# Patient Record
Sex: Male | Born: 1955 | Race: White | Hispanic: No | Marital: Married | Smoking: Former smoker
Health system: Southern US, Community
[De-identification: ages and names within clinical notes are randomized; demographics above are authoritative.]

## PROBLEM LIST (undated history)

## (undated) DIAGNOSIS — N200 Calculus of kidney: Secondary | ICD-10-CM

## (undated) DIAGNOSIS — G4730 Sleep apnea, unspecified: Secondary | ICD-10-CM

## (undated) DIAGNOSIS — K449 Diaphragmatic hernia without obstruction or gangrene: Secondary | ICD-10-CM

## (undated) DIAGNOSIS — E785 Hyperlipidemia, unspecified: Secondary | ICD-10-CM

## (undated) DIAGNOSIS — G43909 Migraine, unspecified, not intractable, without status migrainosus: Secondary | ICD-10-CM

## (undated) DIAGNOSIS — R7309 Other abnormal glucose: Secondary | ICD-10-CM

## (undated) DIAGNOSIS — J8409 Other alveolar and parieto-alveolar conditions: Secondary | ICD-10-CM

## (undated) DIAGNOSIS — M1990 Unspecified osteoarthritis, unspecified site: Secondary | ICD-10-CM

## (undated) DIAGNOSIS — E039 Hypothyroidism, unspecified: Secondary | ICD-10-CM

## (undated) DIAGNOSIS — Z8042 Family history of malignant neoplasm of prostate: Secondary | ICD-10-CM

## (undated) DIAGNOSIS — Z8781 Personal history of (healed) traumatic fracture: Secondary | ICD-10-CM

## (undated) DIAGNOSIS — H3530 Unspecified macular degeneration: Secondary | ICD-10-CM

## (undated) DIAGNOSIS — I10 Essential (primary) hypertension: Secondary | ICD-10-CM

## (undated) DIAGNOSIS — K219 Gastro-esophageal reflux disease without esophagitis: Secondary | ICD-10-CM

## (undated) DIAGNOSIS — E7800 Pure hypercholesterolemia, unspecified: Secondary | ICD-10-CM

## (undated) DIAGNOSIS — M722 Plantar fascial fibromatosis: Secondary | ICD-10-CM

## (undated) DIAGNOSIS — L240 Irritant contact dermatitis due to detergents: Secondary | ICD-10-CM

## (undated) DIAGNOSIS — C801 Malignant (primary) neoplasm, unspecified: Secondary | ICD-10-CM

## (undated) DIAGNOSIS — T7840XA Allergy, unspecified, initial encounter: Secondary | ICD-10-CM

## (undated) HISTORY — DX: Allergy, unspecified, initial encounter: T78.40XA

## (undated) HISTORY — DX: Hypothyroidism, unspecified: E03.9

## (undated) HISTORY — DX: Unspecified macular degeneration: H35.30

## (undated) HISTORY — DX: Other abnormal glucose: R73.09

## (undated) HISTORY — PX: COLONOSCOPY: SHX174

## (undated) HISTORY — DX: Calculus of kidney: N20.0

## (undated) HISTORY — DX: Pure hypercholesterolemia, unspecified: E78.00

## (undated) HISTORY — DX: Personal history of (healed) traumatic fracture: Z87.81

## (undated) HISTORY — DX: Plantar fascial fibromatosis: M72.2

## (undated) HISTORY — DX: Migraine, unspecified, not intractable, without status migrainosus: G43.909

## (undated) HISTORY — DX: Other alveolar and parieto-alveolar conditions: J84.09

## (undated) HISTORY — DX: Family history of malignant neoplasm of prostate: Z80.42

## (undated) HISTORY — PX: OTHER SURGICAL HISTORY: SHX169

## (undated) HISTORY — DX: Sleep apnea, unspecified: G47.30

## (undated) HISTORY — DX: Hyperlipidemia, unspecified: E78.5

## (undated) HISTORY — DX: Malignant (primary) neoplasm, unspecified: C80.1

## (undated) HISTORY — PX: FOOT SURGERY: SHX648

## (undated) HISTORY — DX: Gastro-esophageal reflux disease without esophagitis: K21.9

## (undated) HISTORY — DX: Irritant contact dermatitis due to detergents: L24.0

## (undated) HISTORY — DX: Unspecified osteoarthritis, unspecified site: M19.90

## (undated) HISTORY — DX: Essential (primary) hypertension: I10

## (undated) HISTORY — DX: Diaphragmatic hernia without obstruction or gangrene: K44.9

---

## 1998-10-15 ENCOUNTER — Encounter: Payer: Self-pay | Admitting: Family Medicine

## 1999-04-20 DIAGNOSIS — K219 Gastro-esophageal reflux disease without esophagitis: Secondary | ICD-10-CM

## 1999-04-20 HISTORY — DX: Gastro-esophageal reflux disease without esophagitis: K21.9

## 1999-04-21 ENCOUNTER — Other Ambulatory Visit: Admission: RE | Admit: 1999-04-21 | Discharge: 1999-04-21 | Payer: Self-pay | Admitting: Gastroenterology

## 1999-04-21 ENCOUNTER — Encounter (INDEPENDENT_AMBULATORY_CARE_PROVIDER_SITE_OTHER): Payer: Self-pay | Admitting: Specialist

## 2000-07-14 ENCOUNTER — Encounter: Payer: Self-pay | Admitting: Family Medicine

## 2001-07-14 ENCOUNTER — Encounter: Payer: Self-pay | Admitting: Family Medicine

## 2003-07-27 ENCOUNTER — Emergency Department (HOSPITAL_COMMUNITY): Admission: EM | Admit: 2003-07-27 | Discharge: 2003-07-27 | Payer: Self-pay | Admitting: Emergency Medicine

## 2003-08-15 ENCOUNTER — Encounter: Payer: Self-pay | Admitting: Family Medicine

## 2004-05-04 ENCOUNTER — Ambulatory Visit: Payer: Self-pay | Admitting: Family Medicine

## 2004-08-05 ENCOUNTER — Ambulatory Visit: Payer: Self-pay | Admitting: Family Medicine

## 2004-08-14 ENCOUNTER — Encounter: Payer: Self-pay | Admitting: Family Medicine

## 2004-08-14 LAB — CONVERTED CEMR LAB: PSA: 0.63 ng/mL

## 2004-08-18 ENCOUNTER — Ambulatory Visit: Payer: Self-pay | Admitting: Family Medicine

## 2004-08-20 ENCOUNTER — Ambulatory Visit: Payer: Self-pay | Admitting: Family Medicine

## 2004-09-23 ENCOUNTER — Ambulatory Visit: Payer: Self-pay | Admitting: Pulmonary Disease

## 2004-10-21 ENCOUNTER — Ambulatory Visit (HOSPITAL_BASED_OUTPATIENT_CLINIC_OR_DEPARTMENT_OTHER): Admission: RE | Admit: 2004-10-21 | Discharge: 2004-10-21 | Payer: Self-pay | Admitting: Pulmonary Disease

## 2004-10-27 ENCOUNTER — Ambulatory Visit: Payer: Self-pay | Admitting: Family Medicine

## 2004-10-28 ENCOUNTER — Ambulatory Visit: Payer: Self-pay | Admitting: Pulmonary Disease

## 2004-10-29 ENCOUNTER — Ambulatory Visit: Payer: Self-pay | Admitting: Pulmonary Disease

## 2004-11-24 ENCOUNTER — Ambulatory Visit: Payer: Self-pay | Admitting: Family Medicine

## 2004-12-28 ENCOUNTER — Ambulatory Visit: Payer: Self-pay | Admitting: Pulmonary Disease

## 2005-02-24 ENCOUNTER — Ambulatory Visit: Payer: Self-pay | Admitting: Family Medicine

## 2005-05-31 ENCOUNTER — Ambulatory Visit: Payer: Self-pay | Admitting: Family Medicine

## 2005-08-31 ENCOUNTER — Ambulatory Visit: Payer: Self-pay | Admitting: Family Medicine

## 2005-10-12 ENCOUNTER — Ambulatory Visit: Payer: Self-pay | Admitting: Family Medicine

## 2005-12-16 ENCOUNTER — Ambulatory Visit: Payer: Self-pay | Admitting: Family Medicine

## 2006-01-03 ENCOUNTER — Ambulatory Visit: Payer: Self-pay | Admitting: Family Medicine

## 2006-01-03 LAB — CONVERTED CEMR LAB: PSA: 0.88 ng/mL

## 2006-01-05 ENCOUNTER — Ambulatory Visit: Payer: Self-pay | Admitting: Family Medicine

## 2006-02-06 ENCOUNTER — Ambulatory Visit: Payer: Self-pay | Admitting: Internal Medicine

## 2006-02-16 ENCOUNTER — Ambulatory Visit: Payer: Self-pay | Admitting: Family Medicine

## 2006-03-06 ENCOUNTER — Ambulatory Visit: Payer: Self-pay | Admitting: Family Medicine

## 2006-04-05 ENCOUNTER — Ambulatory Visit: Payer: Self-pay | Admitting: Family Medicine

## 2006-08-02 ENCOUNTER — Ambulatory Visit: Payer: Self-pay | Admitting: Family Medicine

## 2006-10-13 ENCOUNTER — Emergency Department: Payer: Self-pay | Admitting: Emergency Medicine

## 2006-10-19 ENCOUNTER — Emergency Department: Payer: Self-pay | Admitting: Emergency Medicine

## 2007-01-02 ENCOUNTER — Encounter: Payer: Self-pay | Admitting: Family Medicine

## 2007-01-02 DIAGNOSIS — E039 Hypothyroidism, unspecified: Secondary | ICD-10-CM

## 2007-01-02 DIAGNOSIS — R7309 Other abnormal glucose: Secondary | ICD-10-CM

## 2007-01-02 DIAGNOSIS — G43909 Migraine, unspecified, not intractable, without status migrainosus: Secondary | ICD-10-CM

## 2007-01-02 DIAGNOSIS — I10 Essential (primary) hypertension: Secondary | ICD-10-CM

## 2007-01-08 ENCOUNTER — Ambulatory Visit: Payer: Self-pay | Admitting: Family Medicine

## 2007-01-09 LAB — CONVERTED CEMR LAB
Albumin: 3.6 g/dL (ref 3.5–5.2)
Creatinine,U: 49.5 mg/dL
Free T4: 1 ng/dL (ref 0.6–1.6)
GFR calc Af Amer: 83 mL/min
GFR calc non Af Amer: 68 mL/min
HDL: 30.4 mg/dL — ABNORMAL LOW (ref >39.0)
LDL Cholesterol: 131 mg/dL — ABNORMAL HIGH (ref 0–99)
Microalb Creat Ratio: 6.1 mg/g (ref 0.0–30.0)
Microalb, Ur: 0.3 mg/dL (ref 0.0–1.9)
PSA: 0.79 ng/mL (ref 0.10–4.00)
Potassium: 4.5 meq/L (ref 3.5–5.1)
Sodium: 143 meq/L (ref 135–145)
Total Bilirubin: 0.7 mg/dL (ref 0.3–1.2)
Total CHOL/HDL Ratio: 6.1
Triglycerides: 119 mg/dL (ref 0–149)
VLDL: 24 mg/dL (ref 0–40)

## 2007-01-10 ENCOUNTER — Ambulatory Visit: Payer: Self-pay | Admitting: Family Medicine

## 2007-03-05 ENCOUNTER — Encounter (INDEPENDENT_AMBULATORY_CARE_PROVIDER_SITE_OTHER): Payer: Self-pay | Admitting: Internal Medicine

## 2007-03-10 ENCOUNTER — Ambulatory Visit: Payer: Self-pay | Admitting: Internal Medicine

## 2007-03-13 ENCOUNTER — Ambulatory Visit: Payer: Self-pay | Admitting: Family Medicine

## 2007-03-16 ENCOUNTER — Ambulatory Visit: Payer: Self-pay | Admitting: Family Medicine

## 2007-03-22 ENCOUNTER — Telehealth (INDEPENDENT_AMBULATORY_CARE_PROVIDER_SITE_OTHER): Payer: Self-pay | Admitting: *Deleted

## 2007-04-06 ENCOUNTER — Ambulatory Visit: Payer: Self-pay | Admitting: Family Medicine

## 2007-04-07 LAB — CONVERTED CEMR LAB
Chloride: 103 meq/L (ref 96–112)
GFR calc non Af Amer: 68 mL/min
Glucose, Bld: 111 mg/dL — ABNORMAL HIGH (ref 70–99)
Sodium: 139 meq/L (ref 135–145)

## 2007-04-10 ENCOUNTER — Ambulatory Visit: Payer: Self-pay | Admitting: Family Medicine

## 2007-04-25 ENCOUNTER — Telehealth: Payer: Self-pay | Admitting: Family Medicine

## 2007-05-01 ENCOUNTER — Ambulatory Visit: Payer: Self-pay | Admitting: Family Medicine

## 2007-06-05 ENCOUNTER — Ambulatory Visit: Payer: Self-pay | Admitting: Family Medicine

## 2007-06-05 DIAGNOSIS — L240 Irritant contact dermatitis due to detergents: Secondary | ICD-10-CM | POA: Insufficient documentation

## 2007-07-09 ENCOUNTER — Ambulatory Visit: Payer: Self-pay | Admitting: Family Medicine

## 2007-07-10 ENCOUNTER — Encounter: Admission: RE | Admit: 2007-07-10 | Discharge: 2007-07-10 | Payer: Self-pay | Admitting: Family Medicine

## 2007-07-30 ENCOUNTER — Ambulatory Visit: Payer: Self-pay | Admitting: Family Medicine

## 2007-08-21 ENCOUNTER — Ambulatory Visit: Payer: Self-pay | Admitting: Family Medicine

## 2007-08-22 ENCOUNTER — Telehealth: Payer: Self-pay | Admitting: Family Medicine

## 2007-08-29 ENCOUNTER — Encounter: Payer: Self-pay | Admitting: Family Medicine

## 2007-09-04 ENCOUNTER — Ambulatory Visit: Payer: Self-pay | Admitting: Cardiology

## 2007-09-04 ENCOUNTER — Ambulatory Visit: Payer: Self-pay | Admitting: Internal Medicine

## 2007-09-04 DIAGNOSIS — J8409 Other alveolar and parieto-alveolar conditions: Secondary | ICD-10-CM | POA: Insufficient documentation

## 2007-09-04 DIAGNOSIS — Z87891 Personal history of nicotine dependence: Secondary | ICD-10-CM

## 2007-09-04 DIAGNOSIS — J984 Other disorders of lung: Secondary | ICD-10-CM

## 2007-09-04 DIAGNOSIS — Z77090 Contact with and (suspected) exposure to asbestos: Secondary | ICD-10-CM

## 2007-09-05 ENCOUNTER — Ambulatory Visit: Payer: Self-pay | Admitting: Internal Medicine

## 2007-09-19 ENCOUNTER — Ambulatory Visit: Payer: Self-pay | Admitting: Internal Medicine

## 2007-09-26 ENCOUNTER — Telehealth: Payer: Self-pay | Admitting: Internal Medicine

## 2007-12-03 ENCOUNTER — Ambulatory Visit: Payer: Self-pay | Admitting: Cardiovascular Disease

## 2007-12-26 ENCOUNTER — Ambulatory Visit: Payer: Self-pay | Admitting: Internal Medicine

## 2008-02-01 ENCOUNTER — Ambulatory Visit: Payer: Self-pay | Admitting: Internal Medicine

## 2008-02-05 ENCOUNTER — Ambulatory Visit: Payer: Self-pay | Admitting: Cardiology

## 2008-02-06 ENCOUNTER — Telehealth: Payer: Self-pay | Admitting: Internal Medicine

## 2008-02-12 ENCOUNTER — Encounter: Payer: Self-pay | Admitting: Internal Medicine

## 2008-05-30 ENCOUNTER — Encounter (INDEPENDENT_AMBULATORY_CARE_PROVIDER_SITE_OTHER): Payer: Self-pay | Admitting: *Deleted

## 2008-08-08 ENCOUNTER — Ambulatory Visit: Payer: Self-pay | Admitting: Family Medicine

## 2008-09-16 ENCOUNTER — Encounter: Payer: Self-pay | Admitting: Family Medicine

## 2008-09-16 ENCOUNTER — Telehealth: Payer: Self-pay | Admitting: Family Medicine

## 2008-10-06 ENCOUNTER — Ambulatory Visit: Payer: Self-pay | Admitting: Family Medicine

## 2008-10-06 LAB — CONVERTED CEMR LAB
ALT: 29 units/L (ref 0–53)
AST: 26 units/L (ref 0–37)
BUN: 23 mg/dL (ref 6–23)
Basophils Absolute: 0 10*3/uL (ref 0.0–0.1)
Basophils Relative: 0.1 % (ref 0.0–3.0)
Bilirubin, Direct: 0.1 mg/dL (ref 0.0–0.3)
Chloride: 103 meq/L (ref 96–112)
Creatinine,U: 131.3 mg/dL
Direct LDL: 119.2 mg/dL
Eosinophils Relative: 2.6 % (ref 0.0–5.0)
Lymphs Abs: 2.4 10*3/uL (ref 0.7–4.0)
MCHC: 34.4 g/dL (ref 30.0–36.0)
MCV: 91.7 fL (ref 78.0–100.0)
Microalb Creat Ratio: 0.8 mg/g (ref 0.0–30.0)
Microalb, Ur: 0.1 mg/dL (ref 0.0–1.9)
Neutro Abs: 4 10*3/uL (ref 1.4–7.7)
Neutrophils Relative %: 54 % (ref 43.0–77.0)
PSA: 1.54 ng/mL (ref 0.10–4.00)
Platelets: 223 10*3/uL (ref 150.0–400.0)
Potassium: 4.1 meq/L (ref 3.5–5.1)
RDW: 12.7 % (ref 11.5–14.6)
Total Bilirubin: 0.8 mg/dL (ref 0.3–1.2)
VLDL: 39.8 mg/dL (ref 0.0–40.0)

## 2008-10-14 ENCOUNTER — Ambulatory Visit: Payer: Self-pay | Admitting: Family Medicine

## 2008-10-14 DIAGNOSIS — G4730 Sleep apnea, unspecified: Secondary | ICD-10-CM

## 2009-03-30 ENCOUNTER — Telehealth: Payer: Self-pay | Admitting: Family Medicine

## 2009-03-31 ENCOUNTER — Encounter: Payer: Self-pay | Admitting: Family Medicine

## 2009-08-12 ENCOUNTER — Telehealth: Payer: Self-pay | Admitting: Family Medicine

## 2009-10-14 ENCOUNTER — Telehealth: Payer: Self-pay | Admitting: Family Medicine

## 2009-10-15 ENCOUNTER — Encounter: Payer: Self-pay | Admitting: Family Medicine

## 2009-11-11 ENCOUNTER — Ambulatory Visit: Payer: Self-pay | Admitting: Family Medicine

## 2009-11-13 LAB — CONVERTED CEMR LAB
AST: 27 units/L (ref 0–37)
Albumin: 3.9 g/dL (ref 3.5–5.2)
CO2: 31 meq/L (ref 19–32)
Cholesterol: 192 mg/dL (ref 0–200)
Free T4: 0.87 ng/dL (ref 0.60–1.60)
Glucose, Bld: 100 mg/dL — ABNORMAL HIGH (ref 70–99)
HDL: 33.9 mg/dL — ABNORMAL LOW (ref >39.00)
Microalb Creat Ratio: 0.3 mg/g (ref 0.0–30.0)
Phosphorus: 3.7 mg/dL (ref 2.3–4.6)
Potassium: 4.4 meq/L (ref 3.5–5.1)
Sodium: 144 meq/L (ref 135–145)
TSH: 2.26 microintl units/mL (ref 0.35–5.50)
Total CHOL/HDL Ratio: 6
Triglycerides: 254 mg/dL — ABNORMAL HIGH (ref 0.0–149.0)

## 2009-12-08 DIAGNOSIS — E785 Hyperlipidemia, unspecified: Secondary | ICD-10-CM | POA: Insufficient documentation

## 2009-12-08 DIAGNOSIS — Z87442 Personal history of urinary calculi: Secondary | ICD-10-CM

## 2009-12-10 ENCOUNTER — Ambulatory Visit: Payer: Self-pay | Admitting: Family Medicine

## 2009-12-29 ENCOUNTER — Ambulatory Visit: Payer: Self-pay | Admitting: Internal Medicine

## 2009-12-29 DIAGNOSIS — R05 Cough: Secondary | ICD-10-CM

## 2010-01-04 ENCOUNTER — Telehealth: Payer: Self-pay | Admitting: Family Medicine

## 2010-01-04 ENCOUNTER — Ambulatory Visit: Payer: Self-pay | Admitting: Family Medicine

## 2010-01-04 DIAGNOSIS — J019 Acute sinusitis, unspecified: Secondary | ICD-10-CM

## 2010-01-06 ENCOUNTER — Encounter (INDEPENDENT_AMBULATORY_CARE_PROVIDER_SITE_OTHER): Payer: Self-pay | Admitting: *Deleted

## 2010-01-11 ENCOUNTER — Ambulatory Visit: Payer: Self-pay | Admitting: Gastroenterology

## 2010-01-19 ENCOUNTER — Telehealth (INDEPENDENT_AMBULATORY_CARE_PROVIDER_SITE_OTHER): Payer: Self-pay | Admitting: *Deleted

## 2010-01-21 ENCOUNTER — Ambulatory Visit: Payer: Self-pay | Admitting: Gastroenterology

## 2010-01-21 LAB — HM COLONOSCOPY

## 2010-01-28 ENCOUNTER — Ambulatory Visit: Payer: Self-pay | Admitting: Family Medicine

## 2010-01-28 DIAGNOSIS — M545 Low back pain: Secondary | ICD-10-CM | POA: Insufficient documentation

## 2010-04-22 ENCOUNTER — Telehealth: Payer: Self-pay | Admitting: Family Medicine

## 2010-04-26 ENCOUNTER — Ambulatory Visit: Payer: Self-pay | Admitting: Family Medicine

## 2010-04-26 DIAGNOSIS — R9389 Abnormal findings on diagnostic imaging of other specified body structures: Secondary | ICD-10-CM

## 2010-04-27 ENCOUNTER — Encounter: Payer: Self-pay | Admitting: Family Medicine

## 2010-04-27 ENCOUNTER — Ambulatory Visit: Payer: Self-pay

## 2010-04-27 DIAGNOSIS — R0989 Other specified symptoms and signs involving the circulatory and respiratory systems: Secondary | ICD-10-CM

## 2010-05-27 ENCOUNTER — Other Ambulatory Visit: Payer: Self-pay | Admitting: Family Medicine

## 2010-05-27 ENCOUNTER — Ambulatory Visit
Admission: RE | Admit: 2010-05-27 | Discharge: 2010-05-27 | Payer: Self-pay | Source: Home / Self Care | Attending: Family Medicine | Admitting: Family Medicine

## 2010-05-27 LAB — LIPID PANEL
Cholesterol: 162 mg/dL (ref 0–200)
HDL: 31.2 mg/dL — ABNORMAL LOW (ref >39.00)
Total CHOL/HDL Ratio: 5
Triglycerides: 237 mg/dL — ABNORMAL HIGH (ref 0.0–149.0)
VLDL: 47.4 mg/dL — ABNORMAL HIGH (ref 0.0–40.0)

## 2010-05-27 LAB — HEPATIC FUNCTION PANEL
ALT: 39 U/L (ref 0–53)
AST: 29 U/L (ref 0–37)
Albumin: 4.3 g/dL (ref 3.5–5.2)
Alkaline Phosphatase: 37 U/L — ABNORMAL LOW (ref 39–117)
Bilirubin, Direct: 0.1 mg/dL (ref 0.0–0.3)
Total Bilirubin: 0.7 mg/dL (ref 0.3–1.2)
Total Protein: 6.8 g/dL (ref 6.0–8.3)

## 2010-05-27 LAB — LDL CHOLESTEROL, DIRECT: Direct LDL: 90.6 mg/dL

## 2010-06-17 NOTE — Medication Information (Signed)
Summary: Davene Costain [employer] Prior [employer] for Nexium Capsule thro  [employer] Prior [employer] for Nexium Capsule through 04/13/10   Imported By: Beau Fanny 10/16/2009 13:56:45  _____________________________________________________________________  External Attachment:    Type:   Image     Comment:   External Document

## 2010-06-17 NOTE — Progress Notes (Signed)
Summary: prior Berkley Harvey is needed for nexium  Phone Note From Pharmacy   [facility] Summary of Call: Prior auth is needed for nexium, forms are on your shelf. Initial call taken by: Lowella Petties CMA,  October 14, 2009 5:02 PM  Follow-up for Phone Call        Signed. Follow-up by: Shaune Leeks MD,  October 15, 2009 7:33 AM  Additional Follow-up for Phone Call Additional follow up Details #1::        Forms faxed.              Additional Follow-up by: Lowella Petties CMA,  October 15, 2009 8:46 AM

## 2010-06-17 NOTE — Progress Notes (Signed)
Summary: pt requests phone call  Phone Note Call from Patient Call back at Home Phone [phone]   Caller: Patient Call For: [name] MD Summary of Call: Pt had a wisdom tooth cut out last week and some x-rays that were done showed some white spots going down his neck.  He would like to discuss this with you.  He is asking that you call him today if you can. Initial call taken by: Lowella Petties CMA, AAMA,  April 22, 2010 4:21 PM  Follow-up for Phone Call        I advised patient to get a copy of the images and then come in for OV so I could check him and look at the images.  We can proceed with vasc eval from that point as needed.  He agreed and he'll work on getting the scans.  Follow-up by: [name] MD,  April 22, 2010 6:03 PM

## 2010-06-17 NOTE — Assessment & Plan Note (Signed)
Summary: uri/alc   Vital Signs:  Patient profile:   55 year old male Height:      67 inches Weight:      182.75 pounds BMI:     28.73 Temp:     98.4 degrees F oral Pulse rate:   88 / minute Pulse rhythm:   regular BP sitting:   132 / 80  (left arm) Cuff size:   large  Vitals Entered By: Delilah Shan CMA Duncan Dull) (January 04, 2010 2:23 PM) CC: URI   History of Present Illness: Prev note read.  Has profuse rhinorrhea, yellow/green.  Cough with sputum production.  No fevers.   Voice change noted.  Ears feel okay.  Frontal HA.  >1 week symptoms.  Not improving.  No help with SABA use.   Allergies: 1)  ! Codeine Sulfate (Codeine Sulfate)  Review of Systems       See HPI.  Otherwise negative.    Physical Exam  General:  GEN: nad, alert and oriented HEENT: mucous membranes moist, TM w/o erythema, nasal epithelium injected, OP with cobblestoning, frontal and max sinuses tender to palpation  NECK: supple w/o LA CV: rrr. PULM: ctab, no inc wob ABD: soft, +bs EXT: no edema    Impression & Recommendations:  Problem # 1:  ACUTE SINUSITIS, UNSPECIFIED (ICD-461.9) Use naxonex samples 1-2 sprays per nostirl per day.  Rest/fluids and follow up as needed.  Amoxil in meantime.  Routine precuations/instructions given.   His updated medication list for this problem includes:    Fenesin Ir 400 Mg Tabs (Guaifenesin) ..... One in am, one at noon as needed cough    Benzonatate 100 Mg Caps (Benzonatate) ..... One by mouth three times a day as needed cough    Amoxicillin 875 Mg Tabs (Amoxicillin) .Marland Kitchen... 1 by mouth two times a day x10d  Complete Medication List: 1)  Propranolol Hcl 80 Mg Tabs (Propranolol hcl) .... Take one by mouth two times a day 2)  Synthroid 50 Mcg Tabs (Levothyroxine sodium) .... Take one by mouth once a day 3)  Guaifenesin  .... As needed 4)  Maxzide-25 37.5-25 Mg Tabs (Triamterene-hctz) .... One tab by mouth qam 5)  Albuterol Inhalation Solution  .Marland Kitchen.. 1 hhneb tx with  albuterol q4h as needed cough 6)  Nexium 40 Mg Cpdr (Esomeprazole magnesium) .... Two times a day 7)  Omega Red With [place]  .Marland Kitchen.. 1 daily by mouth 8)  Fenesin Ir 400 Mg Tabs (Guaifenesin) .... One in am, one at noon as needed cough 9)  Benzonatate 100 Mg Caps (Benzonatate) .... One by mouth three times a day as needed cough 10)  Amoxicillin 875 Mg Tabs (Amoxicillin) .Marland Kitchen.. 1 by mouth two times a day x10d  Patient Instructions: 1)  Use nasonex 1-2 sprays per nostril per day.  Take the amoxil for 10 days and let me know if you aren't doing better at the end of the 10 days.   2)  Please schedule a follow-up appointment as needed .  Prescriptions: AMOXICILLIN 875 MG TABS (AMOXICILLIN) 1 by mouth two times a day x10d  #20 x 0   Entered and Authorized by:   [name] MD   Signed by:   [name] MD on 01/04/2010   Method used:   [id number]       [address] [facility]  [place], [place]  [address]       Ph: [phone] or [phone]       Fax: [phone]   RxID:   [id number]   Current Allergies (reviewed today): ! CODEINE SULFATE (CODEINE SULFATE)

## 2010-06-17 NOTE — Progress Notes (Signed)
Summary: prep ?'s   Phone Note Call from Patient Call back at Home Phone [phone]   Caller: Patient Call For: Dr. Russella Dar Reason for Call: Talk to Nurse Summary of Call: prep ?'s Initial call taken by: Vallarie Mare,  January 19, 2010 2:44 PM  Follow-up for Phone Call        Phone Call Completed Follow-up by: Wyona Almas RN,  January 19, 2010 3:24 PM

## 2010-06-17 NOTE — Progress Notes (Signed)
Summary: allergies are bothering him  Phone Note Call from Patient Call back at Home Phone [phone]   Caller: Patient Call For: Shaune Leeks MD Summary of Call: Pt states his seasonal allergies are giving him a problem and he is asking what otc he can take with his BP meds. Initial call taken by: Lowella Petties CMA,  August 12, 2009 9:30 AM  Follow-up for Phone Call        Claritn, Allegra or Zyrtec all ok. Any nasal inhaler except Afrin ok. Follow-up by: Shaune Leeks MD,  August 12, 2009 10:08 AM  Additional Follow-up for Phone Call Additional follow up Details #1::        [facility] advising pt. Additional Follow-up by: Lowella Petties CMA,  August 12, 2009 10:25 AM

## 2010-06-17 NOTE — Assessment & Plan Note (Signed)
Summary: Adrian Dean FROM [facility]- CPX  CYD   Vital Signs:  Patient profile:   55 year old male Height:      67 inches Weight:      180.75 pounds BMI:     28.41 Temp:     98.5 degrees F oral Pulse rate:   80 / minute Pulse rhythm:   regular BP sitting:   138 / 88  (left arm) Cuff size:   regular  Vitals Entered By: Delilah Shan CMA Reneta Niehaus Dull) (December 10, 2009 3:08 PM) CC: Transfer from [facility], Preventive Care   History of Present Illness: CPE: see plan  Hypertension:      Using medication without problems or lightheadedness: yes Chest pain with exertion:no Edema:no Short of breath:no Average home BPs: 125/90 Other issues:   Hypothyroid- No overt symptoms of over/underreplacement.  No mass in neck or tenderness.  Recent labs reviewed with patient.  See recent chol/TG/glucose report.   GERD well controlled with current PPI.  No current symptoms.   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 Take One By Mouth Two Times A Day 2)  Synthroid 50 Mcg Tabs (Levothyroxine Sodium) .... Take One By Mouth  Once A Day 3)  Guaifenesin .... As Needed 4)  Maxzide-25 37.5-25 Mg  Tabs (Triamterene-Hctz) .... One Tab By Mouth Qam 5)  Albuterol Inhalation Solution .Marland Kitchen.. 1 Hhneb Tx With Albuterol Q4h As Needed Cough 6)  Nexium 40 Mg  Cpdr (Esomeprazole Magnesium) .... Two Times A Day 7)  Omega Red With [place] .Marland Kitchen.. 1 Daily By Mouth  Allergies: 1)  ! Codeine Sulfate (Codeine Sulfate)  Past History:  Past Surgical History: Last updated: 12/08/2009 [facility]  FRACTURED PELVIS W/ HIP PROB EGD, H. [facility]., GERD 04/20/99 2010, Plantar fasciitis on left foot  Family History: Last updated: 10/14/2008 Father: dec 78  Lung Ca with met dz, caught pneumonia,  prostate cancer at 54 years of age and a heart attack in 2001 Mother: dec  64 HTN, cirrhosis of the liver, and obesity from nonalcoholic problems Brother A 55  Bladder tumor, malignant CV + Father MI, angiopl  (stents) HBP + Self, mother DM - none Prostate cancer + Father Depression - none ETOH/Drug abuse - none Stroke - none cancer-lung--father, grandfather  Social History: Last updated: 12/10/2009 Marital Status: Married, since 1982 Cubs fan Children: One child at home with muscular dystrophy 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 Father: dec 78  Lung Ca with met dz, caught pneumonia,  prostate cancer at 54 years of age and a heart attack in 2001 Mother: dec  70 HTN, cirrhosis of the liver, and obesity from nonalcoholic problems Brother A 55  Bladder tumor, malignant CV + Father MI, angiopl  (stents) HBP + Self, mother DM - none Prostate cancer + Father Depression - none ETOH/Drug abuse - none Stroke - none cancer-lung--father, grandfather  Social History: Reviewed history from 12/08/2009 and no changes required. 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  Otherwise negative.   "I feel good."  Physical Exam  General:  GEN: nad, alert and oriented HEENT: mucous membranes moist NECK: supple w/o LA CV: rrr.  no murmur PULM: ctab, no inc wob ABD: soft, +bs EXT: no edema SKIN: no acute rash  Rectal:  No external abnormalities noted. Normal sphincter tone. No rectal masses or tenderness. Prostate:  Prostate gland firm and smooth, no enlargement, nodularity, tenderness, mass, asymmetry or induration.   Impression & Recommendations:  Problem # 1:  Preventive Health Care (ICD-V70.0) Refer to GI.  Labs reviewed.  D/w patient [id number].  Flul shot encouraged.   Problem # 2:  HYPOTHYROIDISM (ICD-244.9) No change in meds.  His updated medication list for this problem includes:    Synthroid 50 Mcg Tabs (Levothyroxine sodium) .Marland Kitchen... Take one by  mouth once a day  Problem # 3:  HYPERTENSION (ICD-401.9) No change in meds.  D/w patient [id number] and exercise.  His updated medication list for this problem includes:    Propranolol Hcl 80 Mg Tabs (Propranolol hcl) .Marland Kitchen... Take one by mouth two times a day    Maxzide-25 37.5-25 Mg Tabs (Triamterene-hctz) ..... One tab by mouth qam  Complete Medication List: 1)  Propranolol Hcl 80 Mg Tabs (Propranolol hcl) .... Take one by mouth two times a day 2)  Synthroid 50 Mcg Tabs (Levothyroxine sodium) .... Take one by mouth once a day 3)  Guaifenesin  .... As needed 4)  Maxzide-25 37.5-25 Mg Tabs (Triamterene-hctz) .... One tab by mouth qam 5)  Albuterol Inhalation Solution  .Marland Kitchen.. 1 hhneb tx with albuterol q4h as needed cough 6)  Nexium 40 Mg Cpdr (Esomeprazole magnesium) .... Two times a day 7)  Omega Red With [place]  .Marland Kitchen.. 1 daily by mouth  Other Orders: Gastroenterology Referral (GI)  Colorectal Screening:  Current Recommendations:    Hemoccult: NEG X 1 today    Colonoscopy recommended: scheduled with G.I.  PSA Screening:    PSA: 1.10  (11/11/2009)  Immunization & Chemoprophylaxis:    Tetanus vaccine: Td  (01/05/2006)    Influenza vaccine: Fluvax 3+  (02/01/2008)  Patient Instructions: 1)  See Aram Beecham about your referral before your leave today.   Prescriptions: NEXIUM 40 MG  CPDR (ESOMEPRAZOLE MAGNESIUM) two times a  day  #60 x 11   Entered and Authorized by:   [name] MD   Signed by:   [name] MD on 12/10/2009   Method used:   [facility]        [id number]       [address]       [place], [place]  [address]       Ph: [phone] or [phone]       Fax: [phone]   RxID:   [id number] MAXZIDE-25 37.5-25 MG  TABS (TRIAMTERENE-HCTZ) one tab by mouth qam  #30 Tablet x 11   Entered and Authorized by:   [name] MD   Signed by:   [name] MD on 12/10/2009   Method used:   [facility]        [id number]       [address]       [place], [place]  [address]       Ph: [phone] or [phone]       Fax: [phone]   RxID:   [id number] SYNTHROID 50 MCG TABS (LEVOTHYROXINE SODIUM) Take one by mouth once a day  #30 x 11   Entered and Authorized by:   [name] MD   Signed by:   [name] MD on 12/10/2009   Method used:   [facility]        [id number]       [address]       [place], [place]  [address]       Ph: [phone] or [phone]       Fax: [phone]   RxID:   [id number] PROPRANOLOL HCL 80 MG TABS (PROPRANOLOL HCL) Take one by mouth two times a day  #60 Tablet x 11   Entered and Authorized by:   [name] MD   Signed by:   [name] MD on 12/10/2009   Method used:   [facility]        [id number]       [address]       [place], [place]  [address]       Ph: [phone] or [phone]       Fax: [phone]   RxID:   [id number]   Current Allergies (reviewed today): ! CODEINE SULFATE (CODEINE SULFATE)      Prevention & Chronic Care Immunizations   Influenza vaccine: Fluvax 3+  (02/01/2008)    Tetanus booster: 01/05/2006: Td    Pneumococcal vaccine: Not documented  Colorectal Screening   Hemoccult: Not documented   Hemoccult action/deferral: NEG X 1 today  (12/10/2009)    Colonoscopy: Not documented   Colonoscopy action/deferral: scheduled with G.I.  (12/10/2009)  Other Screening   PSA: 1.10  (11/11/2009)   Smoking status: quit  (12/08/2009)  Lipids   Total Cholesterol: 192  (11/11/2009)   LDL: 131  (01/08/2007)   LDL Direct: 97.9  (11/11/2009)   HDL: 33.90  (11/11/2009)   Triglycerides: 254.0  (11/11/2009)    SGOT (AST): 27  (11/11/2009)   SGPT (ALT): 39  (11/11/2009)   Alkaline phosphatase: 33  (11/11/2009)  Total bilirubin: 0.4  (11/11/2009)    Lipid flowsheet  reviewed?: Yes  Hypertension   Last Blood Pressure: 138 / 88  (12/10/2009)   Serum creatinine: 1.1  (11/11/2009)   Serum potassium 4.4  (11/11/2009)    Hypertension flowsheet reviewed?: Yes  Self-Management Support :    Hypertension self-management support: Not documented    Lipid self-management support: Not documented

## 2010-06-17 NOTE — Miscellaneous (Signed)
Summary: Orders Update  Clinical Lists Changes  Problems: Added new problem of OTHER SYMPTOMS INVOLVING CARDIOVASCULAR SYSTEM (ICD-785.9) Orders: Added new Test order of Carotid Duplex (Carotid Duplex) - Signed 

## 2010-06-17 NOTE — Assessment & Plan Note (Signed)
Summary: OV per Dr. Para March   Vital Signs:  Patient profile:   55 year old male Height:      67 inches Weight:      178.25 pounds BMI:     28.02 Temp:     98.6 degrees F oral Pulse rate:   72 / minute Pulse rhythm:   regular BP sitting:   122 / 94  (left arm) Cuff size:   large  Vitals Entered By: Delilah Shan CMA Duncan Dull) (April 26, 2010 3:36 PM) CC: OV per Dr. Para March   History of Present Illness: Prev with tolerance to lipitor but had to come off due to finances.  Didn't tolerate crestor due to increase in LFTs.    Xrays done at dental office with opacity in the R side of neck, presumed to be near/in the carotid. No h/o CVA, TIA.  BP controlled.  No known bruit.  here for eval.   Allergies: 1)  ! Codeine Sulfate (Codeine Sulfate)  Review of Systems       See HPI.  Otherwise negative.    Physical Exam  General:  NCAT MMM neck supple, equal carotid upstroke w/o bruit RRR clear to auscultation bilaterally 2+ radial pulses, no bilateral lower extremity edema   Impression & Recommendations:  Problem # 1:  HYPERLIPIDEMIA (ICD-272.4) Start back on lipitor and return for fasting labs.  He agrees.  If myalgias, notify clinic.  Orders: Prescription Created Electronically [phone]) Radiology Referral (Radiology)  His updated medication list for this problem includes:    Lipitor 10 Mg Tabs (Atorvastatin calcium) .Marland Kitchen... 1 by mouth at bedtime  Problem # 2:  OTH NONSPC ABN FINDNG RAD&OTH EXM BODY STRUCTURE (ICD-793.99) Send for carotid dopplers with result to dictate tx from that point onward.  he agrees.  If any symptoms of CVA, the to ER emergently. he agrees.   Complete Medication List: 1)  Propranolol Hcl 80 Mg Tabs (Propranolol hcl) .... Take one by mouth two times a day 2)  Synthroid 50 Mcg Tabs (Levothyroxine sodium) .... Take one by mouth once a day 3)  Guaifenesin  .... As needed 4)  Maxzide-25 37.5-25 Mg Tabs (Triamterene-hctz) .... Take 1 tablet by mouth every  morning 5)  Albuterol Inhalation Solution  .Marland Kitchen.. 1 hhneb tx with albuterol q4h as needed cough 6)  Nexium 40 Mg Cpdr (Esomeprazole magnesium) .... Two times a day 7)  Omega Red With [place]  .Marland Kitchen.. 1 daily by mouth 8)  Flexeril 10 Mg Tabs (Cyclobenzaprine hcl) .... 0.5-1 tab by mouth three times a day as needed for pain with sedation caution. 9)  Vicodin 5-500 Mg Tabs (Hydrocodone-acetaminophen) .Marland Kitchen.. 1-2 tabs by mouth three times a day as needed for pain with sedation caution 10)  Lipitor 10 Mg Tabs (Atorvastatin calcium) .Marland Kitchen.. 1 by mouth at bedtime  Patient Instructions: 1)  See Shirlee Limerick about your referral before your leave today.  2)  Come back for fasting labs in 1 months.  Lipid and hepatic panel, dx 272.0. 3)  Start the lipitor today and take 1 at night.  Let me know if you have muscle aches on it.  Take care.  Prescriptions: LIPITOR 10 MG TABS (ATORVASTATIN CALCIUM) 1 by mouth at bedtime  #90 x 3   Entered and Authorized by:   [name] MD   Signed by:   [name] MD on 04/26/2010   Method used:   [facility]        [phone] [facility]  retail)       [address]       [place], [place]  [address]       Ph: [phone] or [phone]       Fax: [phone]   RxID:   [id number]    Orders Added: 1)  Est. Patient Level III [27253] 2)  Prescription Created Electronically [phone] 3)  Radiology Referral [Radiology]    Current Allergies (reviewed today): ! CODEINE SULFATE (CODEINE SULFATE)

## 2010-06-17 NOTE — Letter (Signed)
Summary: Moviprep Instructions  [facility]  [address]   [place], [place] [address]   Phone: [phone]  Fax: [phone]       Adrian Dean    11-10-1955    MRN: [id number]        Procedure Day Dorna Bloom: Thursday, 01-21-10     Arrival Time: 10:00 a.m.     Procedure Time: 11:00 a.m.     Location of Procedure:                    x  [facility] (4th Floor)   PREPARATION FOR COLONOSCOPY WITH MOVIPREP   Starting 5 days prior to your procedure 01-16-10  do not eat nuts, seeds, popcorn, corn, beans, peas,  salads, or any raw vegetables.  Do not take any fiber supplements (e.g. Metamucil, Citrucel, and Benefiber).  THE DAY BEFORE YOUR PROCEDURE         DATE:  01-20-10   DAY: Wednesday  1.  Drink clear liquids the entire day-NO SOLID FOOD  2.  Do not drink anything colored red or purple.  Avoid juices with pulp.  No orange juice.  3.  Drink at least 64 oz. (8 glasses) of fluid/clear liquids during the day to prevent dehydration and help the prep work efficiently.  CLEAR LIQUIDS INCLUDE: Water Jello Ice Popsicles Tea (sugar ok, no milk/cream) Powdered fruit flavored drinks Coffee (sugar ok, no milk/cream) Gatorade Juice: apple, white grape, white cranberry  Lemonade Clear bullion, consomm, broth Carbonated beverages (any kind) Strained chicken noodle soup Hard Candy                             4.  In the morning, mix first dose of MoviPrep solution:    Empty 1 Pouch A and 1 Pouch B into the disposable container    Add lukewarm drinking water to the top line of the container. Mix to dissolve    Refrigerate (mixed solution should be used within 24 hrs)  5.  Begin drinking the prep at 5:00 p.m. The MoviPrep container is divided by 4 marks.   Every 15 minutes drink the solution down to the next mark (approximately 8 oz) until the full liter is complete.   6.  Follow completed prep with 16 oz of clear liquid of your choice (Nothing red or  purple).  Continue to drink clear liquids until bedtime.  7.  Before going to bed, mix second dose of MoviPrep solution:    Empty 1 Pouch A and 1 Pouch B into the disposable container    Add lukewarm drinking water to the top line of the container. Mix to dissolve    Refrigerate  THE DAY OF YOUR PROCEDURE      DATE: 01-21-10  DAY: Thursday  Beginning at 6:00 a.m. (5 hours before procedure):         1. Every 15 minutes, drink the solution down to the next mark (approx 8 oz) until the full liter is complete.  2. Follow completed prep with 16 oz. of clear liquid of your choice.    3. You may drink clear liquids until  9:00 a.m. (2 HOURS BEFORE PROCEDURE).   MEDICATION INSTRUCTIONS  Unless otherwise instructed, you should take regular prescription medications with a small sip of water   as early as possible the morning of your procedure.   Additional medication instructions: Do NOT take Maxzide on day of procedure.  OTHER INSTRUCTIONS  You will need a responsible adult at least 54 years of age to accompany you and drive you home.   This person must remain in the waiting room during your procedure.  Wear loose fitting clothing that is easily removed.  Leave jewelry and other valuables at home.  However, you may wish to bring a book to read or  an iPod/MP3 player to listen to music as you wait for your procedure to start.  Remove all body piercing jewelry and leave at home.  Total time from sign-in until discharge is approximately 2-3 hours.  You should go home directly after your procedure and rest.  You can resume normal activities the  day after your procedure.  The day of your procedure you should not:   Drive   Make legal decisions   Operate machinery   Drink alcohol   Return to work  You will receive specific instructions about eating, activities and medications before you leave.    The above instructions have been reviewed and explained to me by    Durwin Glaze RN  January 11, 2010 4:03 PM     I fully understand and can verbalize these instructions _____________________________ Date _________

## 2010-06-17 NOTE — Procedures (Signed)
Summary: Colonoscopy  Patient: [name] Note: All result statuses are Final unless otherwise noted.  Tests: (1) Colonoscopy (COL)   COL Colonoscopy           DONE     [facility]     [address]     [place], [place]  [address]           COLONOSCOPY PROCEDURE REPORT           PATIENT:  Adrian, Dean  MR#:  [id number]     BIRTHDATE:  1956-04-14, 53 yrs. old  GENDER:  male     ENDOSCOPIST:  Judie Petit T. Russella Dar, MD, [facility]     Referred by:  [name], M.D.     PROCEDURE DATE:  01/21/2010     PROCEDURE:  Colonoscopy [id number]     ASA CLASS:  Class II     INDICATIONS:  1) Routine Risk Screening     MEDICATIONS:   Fentanyl 75 mcg IV, Versed 8 mg IV     DESCRIPTION OF PROCEDURE:   After the risks benefits and     alternatives of the procedure were thoroughly explained, informed     consent was obtained.  Digital rectal exam was performed and     revealed no abnormalities.   The LB [id number] endoscope     was introduced through the anus and advanced to the cecum, which     was identified by both the appendix and ileocecal valve, without     limitations.  The quality of the prep was excellent, using     MoviPrep.  The instrument was then slowly withdrawn as the colon     was fully examined.     <<PROCEDUREIMAGES>>     FINDINGS:  A normal appearing cecum, ileocecal valve, and     appendiceal orifice were identified. The ascending, hepatic     flexure, transverse, splenic flexure, descending, sigmoid colon,     and rectum appeared unremarkable. Retroflexed views in the rectum     revealed no abnormalities. The time to cecum =  1.75  minutes. The     scope was then withdrawn (time =  9.5  min) from the patient and     the procedure completed.           COMPLICATIONS:  None           ENDOSCOPIC IMPRESSION:     1) Normal colon           RECOMMENDATIONS:     1) Continue colorectal screening for "routine risk" patients     with a repeat colonoscopy in 10 years.          Venita Lick. Russella Dar, MD, [facility]           n.     eSIGNED:   Venita Lick. Stark at 01/21/2010 12:05 PM           [name], [name], [id number]  Note: An exclamation mark (!) indicates a result that was not dispersed into the flowsheet. Document Creation Date: 01/21/2010 12:06 PM _______________________________________________________________________  (1) Order result status: Final Collection or observation date-time: 01/21/2010 12:03 Requested date-time:  Receipt date-time:  Reported date-time:  Referring Physician:   Ordering Physician: Claudette Head [phone]) Specimen Source:  Source: Launa Grill Order Number: [id number] Lab site:   Appended Document: Colonoscopy    Clinical Lists Changes  Observations: Added new observation of COLONNXTDUE: 01/2020 (01/21/2010 13:07)

## 2010-06-17 NOTE — Miscellaneous (Signed)
Summary: [facility]  Clinical Lists Changes  Medications: Added new medication of MOVIPREP 100 GM  SOLR (PEG-KCL-NACL-NASULF-NA ASC-C) As per prep instructions. - Signed Rx of MOVIPREP 100 GM  SOLR (PEG-KCL-NACL-NASULF-NA ASC-C) As per prep instructions.;  #1 x 0;  Signed;  Entered by: Durwin Glaze RN;  Authorized by: Meryl Dare MD [facility];  Method [facility]: [id number] [address], [place], [place]  [address], Ph: [phone] or [phone], Fax: [phone] Observations: Added new observation of ALLERGY REV: Done (01/11/2010 15:17)    Prescriptions: MOVIPREP 100 GM  SOLR (PEG-KCL-NACL-NASULF-NA ASC-C) As per prep instructions.  #1 x 0   Entered by:   Durwin Glaze RN   Authorized by:   Meryl Dare MD [facility]   Signed by:   Durwin Glaze RN on 01/11/2010   Method used:   [facility]        [id number]ail[facility]       [address]       [place], [place]  [address]       Ph: [phone] or [phone]       Fax: [phone]   RxID:   [id number]

## 2010-06-17 NOTE — Assessment & Plan Note (Signed)
Summary: BACK PAIN SINCE SATURDAY/CLE   Vital Signs:  Patient profile:   55 year old male Height:      67 inches Weight:      179.75 pounds BMI:     28.25 Temp:     98.4 degrees F oral Pulse rate:   80 / minute Pulse rhythm:   regular BP sitting:   134 / 90  (left arm) Cuff size:   large  Vitals Entered By: Delilah Shan CMA Duncan Dull) (January 28, 2010 8:42 AM) CC: Back pain since Saturday   History of Present Illness: Back pain- Got up Saturday and was working on cars- washing them.  "Something caught when I was bending over."  Middle of lower back hurting since then.  Better standing.  Has to roll out of bed.  No radiation into legs but occ burning in thighs anteriorly (old sx).   No weakness.  No foot drop.  No audible pop heard  by patient.  No FCNAV.  No change in urine/bowels.   no relief with advil or heating pad.   Wants flu shot.   Allergies: 1)  ! Codeine Sulfate (Codeine Sulfate)  Review of Systems       See HPI.  Otherwise negative.    Physical Exam  General:  GEN: nad, alert and oriented, uncomfortable HEENT: mucous membranes moist NECK: supple CV: rrr.  no murmur PULM: ctab, no inc wob ABD: soft, +bs EXT: no edema Back: tender in a band across the lower back at waistline.  pain with back ext.  Less pain with flexion.  SLR neg bilaterally but pain with SI testing bilaterally.  distally NV intact.    Impression & Recommendations:  Problem # 1:  BACK PAIN (ICD-724.5) D/w patient UE:[id number] and med use.  Sedation caution.  follow up as needed.  this should get better gradually.  Likely combination of facet and SI irritation with subsequent muscle spasm.  No indication for imaging.  Relevant anatomy d/w patient.  His updated medication list for this problem includes:    Flexeril 10 Mg Tabs (Cyclobenzaprine hcl) .Marland Kitchen... 0.5-1 tab by mouth three times a day as needed for pain with sedation caution.    Vicodin 5-500 Mg Tabs (Hydrocodone-acetaminophen) .Marland Kitchen... 1-2  tabs by mouth three times a day as needed for pain with sedation caution  Complete Medication List: 1)  Propranolol Hcl 80 Mg Tabs (Propranolol hcl) .... Take one by mouth two times a day 2)  Synthroid 50 Mcg Tabs (Levothyroxine sodium) .... Take one by mouth once a day 3)  Guaifenesin  .... As needed 4)  Maxzide-25 37.5-25 Mg Tabs (Triamterene-hctz) .... Take 1 tablet by mouth every morning 5)  Albuterol Inhalation Solution  .Marland Kitchen.. 1 hhneb tx with albuterol q4h as needed cough 6)  Nexium 40 Mg Cpdr (Esomeprazole magnesium) .... Two times a day 7)  Omega Red With [place]  .Marland Kitchen.. 1 daily by mouth 8)  Flexeril 10 Mg Tabs (Cyclobenzaprine hcl) .... 0.5-1 tab by mouth three times a day as needed for pain with sedation caution. 9)  Vicodin 5-500 Mg Tabs (Hydrocodone-acetaminophen) .Marland Kitchen.. 1-2 tabs by mouth three times a day as needed for pain with sedation caution  Other Orders: Flu Vaccine 82yrs + MEDICARE PATIENTS (Y7829) Administration Flu vaccine - MCR (F6213)  Patient Instructions: 1)  Stretch your back, take ibuprofen as needed (with food), and use the flexeril and vicodin.  Both can make you drowsy.  If you aren't getting better, we can set  you up with physical therapy.  Take care.  Prescriptions: VICODIN 5-500 MG TABS (HYDROCODONE-ACETAMINOPHEN) 1-2 tabs by mouth three times a day as needed for pain with sedation caution  #30 x 1   Entered and Authorized by:   [name] MD   Signed by:   [name] MD on 01/28/2010   Method used:   Print then Give to Patient   RxID:   [id number] FLEXERIL 10 MG TABS (CYCLOBENZAPRINE HCL) 0.5-1 tab by mouth three times a day as needed for pain with sedation caution.  #30 x 1   Entered and Authorized by:   [name] MD   Signed by:   [name] MD on 01/28/2010   Method used:   [id number]       [address]       [place], [place]  [address]       Ph: [phone] or  [phone]       Fax: [phone]   RxID:   [id number]   Current Allergies (reviewed today): ! CODEINE SULFATE (CODEINE SULFATE)  Flu Vaccine Consent Questions     Do you have a history of severe allergic reactions to this vaccine? no    Any prior history of allergic reactions to egg and/or gelatin? no    Do you have a sensitivity to the preservative Thimersol? no    Do you have a past history of Guillan-Barre Syndrome? no    Do you currently have an acute febrile illness? no    Have you ever had a severe reaction to latex? no    Vaccine information given and explained to patient? yes    Are you currently pregnant? no    Lot Number:[id number]   Exp Date:11/13/2010   Site Given  Left Deltoid IMedflu Lugene Fuquay CMA (AAMA)  January 28, 2010 9:11 AM

## 2010-06-17 NOTE — Progress Notes (Signed)
Summary: not feeling any better  Phone Note Call from Patient Call back at Home Phone [phone]   Caller: Patient Call For: [name] MD Summary of Call: Patient was seen on 12-29-09. He was told to call back if not feeling any better. Patient says that he has runny nose and alot of congestion. Feels feverish, but doesn't know for sure if he has a fever or not. [facility] [place]  Initial call taken by: Melody Comas,  January 04, 2010 11:25 AM  Follow-up for Phone Call        can we have pt come in if not feeling better?  may need to be seen to ensure not becoming sinusitis. Follow-up by: Eustaquio Boyden  MD,  January 04, 2010 11:40 AM  Additional Follow-up for Phone Call Additional follow up Details #1::        Scheduled patient in available slot w/ Dr. Para March for today.  Additional Follow-up by: Melody Comas,  January 04, 2010 1:24 PM    Additional Follow-up for Phone Call Additional follow up Details #2::    thanks. Follow-up by: Eustaquio Boyden  MD,  January 04, 2010 1:38 PM  Additional Follow-up for Phone Call Additional follow up Details #3:: Additional Follow-up by: [name] MD,  January 04, 2010 1:48 PM

## 2010-06-17 NOTE — Assessment & Plan Note (Signed)
Summary: 11:30AM - CHEST COLD / LFW   Vital Signs:  Patient profile:   55 year old male Weight:      180.50 pounds Temp:     98.1 degrees F tympanic Pulse rate:   80 / minute Pulse rhythm:   regular BP sitting:   160 / 102  (left arm) Cuff size:   regular  Vitals Entered By: Selena Batten Dance CMA Duncan Dull) (December 29, 2009 11:23 AM) CC: Chest cold/congestion   History of Present Illness: CC: chest cold?  3d h/o RN, sinus drainage, eyes watering, ST, now chest congestion.  No fevers, chills, nausea,vomiting, diarrhea.  No abd pain, HA, sinus pain.  + tired.  + wife with sinus congestion at home.  + productive cough (yellow).  + postnasal drip.  + history of seasonal allergies.  Using ibuprofen, mucinex 12 hr, doesn't notice much difference.  [place].  Recently returned from [facility] vacation.  Son with muscular dystrophy, brother in hospital, recently had surgery (bladder CA).  Pt doesn't want anyone else getting sick.  No smokers at home.  bp typically runs normal range when not sick.  Preventive Screening-Counseling & Management  Alcohol-Tobacco     Smoking Status: quit  Current Medications (verified): 1)  Propranolol Hcl 80 Mg Tabs (Propranolol Hcl) .... Take One By Mouth Two Times A Day 2)  Synthroid 50 Mcg Tabs (Levothyroxine Sodium) .... Take One By Mouth Once A Day 3)  Guaifenesin .... As Needed 4)  Maxzide-25 37.5-25 Mg  Tabs (Triamterene-Hctz) .... One Tab By Mouth Qam 5)  Albuterol Inhalation Solution .Marland Kitchen.. 1 Hhneb Tx With Albuterol Q4h As Needed Cough 6)  Nexium 40 Mg  Cpdr (Esomeprazole Magnesium) .... Two Times A Day 7)  Omega Red With [place] .Marland Kitchen.. 1 Daily By Mouth  Allergies: 1)  ! 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) SLEEP APNEA (ICD-780.57) CONTACT DERMATITIS&OTHER ECZEMA DUE DETERGENTS (ICD-692.0) HEALTH MAINTENANCE EXAM (ICD-V70.0) SCREENING FOR MALIGNANT NEOPLASM, PROSTATE (ICD-V76.44) HYPERGLYCEMIA (ICD-790.29) RENAL CALCULUS (ICD-592.0) MIGRAINE HEADACHE (ICD-346.90) HYPERCHOLESTEROLEMIA/TRIG 226/267/131 (ICD-272.0) HX, FAMILY, MALIGNANCY, PROSTATE (YEARLY PSA'S) (ICD-V16.42) HYPOTHYROIDISM (ICD-244.9) HYPERTENSION (ICD-401.9) PMH-FH-SH reviewed for relevance  Review of Systems       per HPI  Physical Exam  General:  Well-developed,well-nourished,in no acute distress; alert,appropriate and cooperative throughout examination Head:  Normocephalic and atraumatic without obvious abnormalities. No apparent alopecia or balding.  no sinus pain Eyes:  No corneal or conjunctival inflammation noted. EOMI. Perrla. Ears:  canals bilaterally filled with cerumen. Nose:  pale nasal turbinates, not swollen Mouth:  Oral mucosa and oropharynx without lesions or exudates.  Teeth in good repair.  slight pharyngeal erythema Neck:  No deformities, masses, or tenderness noted.  + mild thyromegaly Lungs:  Normal respiratory effort, chest expands symmetrically. Lungs are clear to auscultation, no crackles or wheezes. Heart:  Normal rate and regular rhythm. S1 and S2 normal without gallop, murmur, click, rub or other extra sounds. Pulses:  2+ periph pulses Extremities:  no clubbing, cyanosis, edema, or deformity noted Skin:  intact without lesions or rashes   Impression & Recommendations:  Problem # 1:  COUGH (ICD-786.2) h/o allergies.  3 day hx illness, likely viral URTI.  Wife with similar sxs.  Treat supportively.  codeine allergy, prefers to stay away from narcotics as works during day.  trial of benzonatate perls and guaifenesin.  RTC for red flags or if not improving as expected.  Complete Medication List: 1)   Propranolol Hcl 80 Mg Tabs (Propranolol hcl) .... Take one by mouth two times a day 2)  Synthroid 50 Mcg Tabs (Levothyroxine sodium) .... Take one by mouth once a day 3)  Guaifenesin  .... As needed 4)  Maxzide-25 37.5-25 Mg Tabs (Triamterene-hctz) .... One tab by mouth qam 5)  Albuterol Inhalation Solution  .Marland Kitchen.. 1 hhneb tx with albuterol q4h as needed cough 6)  Nexium 40 Mg Cpdr (Esomeprazole magnesium) .... Two times a day 7)  Omega Red With [place]  .Marland Kitchen.. 1 daily by mouth 8)  Fenesin Ir 400 Mg Tabs (Guaifenesin) .... One in am, one at noon as needed cough 9)  Benzonatate 100 Mg Caps (Benzonatate) .... One by mouth three times a day as needed cough  Patient Instructions: 1)  Sounds like you have a viral upper respiratory infection. 2)  Antibiotics are not needed for this.  Viral infections usually take 7-10 days to resolve.  The cough can last up to 4-6 weeks to go away. 3)  Use medication as prescribed: tessalon perls for cough suppression (let us know if not working), guaifenesin 400mg  one in am and one at noon.  Drink plenty of fluid with guaifenesin. 4)  Please return if you are not improving as expected, or if you have high fevers (>101.5) or difficulty swallowing or worsening cough. 5)  Call clinic with questions.  Pleasure to see you today!  Prescriptions: BENZONATATE 100 MG CAPS (BENZONATATE) one by mouth three times a day as needed cough  #30 x 1   Entered and Authorized by:   Eustaquio Boyden  MD   Signed by:   Eustaquio Boyden  MD on 12/29/2009   Method used:   [facility] [facility]ail[facility]       [address]       [place], [place]  [address]       Ph: [phone] or [phone]       Fax: [phone]   RxID:   [id number] FENESIN IR 400 MG TABS (GUAIFENESIN) one in am, one at noon as needed cough  #30 x 0   Entered and Authorized by:   Eustaquio Boyden  MD   Signed by:   Eustaquio Boyden  MD on 12/29/2009   Method used:    [facility]        [facility]  [address] (retail)       [address]       [place], [place]  [address]       Ph: [phone] or [phone]       Fax: [phone]   RxID:   [id number] BENZONATATE 100 MG CAPS (BENZONATATE) one by mouth three times a day as needed cough  #30 x 1   Entered and Authorized by:   Eustaquio Boyden  MD   Signed by:   Eustaquio Boyden  MD on 12/29/2009   Method used:   [facility]        [id number]       [address]       [place], [place]  [address]       Ph: [phone] or [phone]       Fax: [phone]   RxID:  [id number] FENESIN IR 400 MG TABS (GUAIFENESIN) one in am, one at noon as needed cough  #30 x 0   Entered and Authorized by:   Eustaquio Boyden  MD   Signed by:   Eustaquio Boyden  MD on 12/29/2009   Method used:   [facility]        [id number]ail[facility]       [address]       [place], [place]  [address]       Ph: [phone] or [phone]       Fax: [phone]   RxID:   [id number]   Current Allergies (reviewed today): ! CODEINE SULFATE (CODEINE SULFATE)

## 2010-06-21 ENCOUNTER — Ambulatory Visit (INDEPENDENT_AMBULATORY_CARE_PROVIDER_SITE_OTHER): Payer: BC Managed Care – PPO | Admitting: Family Medicine

## 2010-06-21 ENCOUNTER — Encounter: Payer: Self-pay | Admitting: Family Medicine

## 2010-06-21 DIAGNOSIS — J069 Acute upper respiratory infection, unspecified: Secondary | ICD-10-CM

## 2010-06-23 ENCOUNTER — Telehealth: Payer: Self-pay | Admitting: Family Medicine

## 2010-07-01 NOTE — Progress Notes (Signed)
Summary: wants antibiotic   Phone Note Call from Patient Call back at Home Phone [phone] P PH     Caller: Patient Call For: [name] MD Summary of Call: Patient was seen on monday, he says that he is not feeling any better and he is asking for an antibiotic to be [facility]  Initial call taken by: Melody Comas,  June 23, 2010 11:00 AM  Follow-up for Phone Call        thought may be early sinusits 2 days ago, may call in amoxicillin.  update if not better.  plz call. Follow-up by: Eustaquio Boyden  MD,  June 23, 2010 1:44 PM  Additional Follow-up for Phone Call Additional follow up Details #1::        Patient notified of rx.  Additional Follow-up by: Melody Comas,  June 23, 2010 2:01 PM    New/Updated Medications: AMOXICILLIN 875 MG TABS (AMOXICILLIN) take one by mouth two times a day x 10 days Prescriptions: AMOXICILLIN 875 MG TABS (AMOXICILLIN) take one by mouth two times a day x 10 days  #20 x 0   Entered by:   Eustaquio Boyden  MD   Authorized by:   [name] MD   Signed by:   Eustaquio Boyden  MD on 06/23/2010   Method used:   [id number]       [address]       [place], [place]  [address]       Ph: [phone] or [phone]       Fax: [phone]   RxID:   [id number]

## 2010-07-01 NOTE — Assessment & Plan Note (Signed)
Summary: COLD/CLE  BCBS   Vital Signs:  Patient profile:   55 year old male Weight:      183.25 pounds Temp:     98.3 degrees F oral Pulse rate:   68 / minute Pulse rhythm:   regular BP sitting:   124 / 80  (left arm) Cuff size:   large  Vitals Entered By: Selena Batten Dance CMA Duncan Dull) (June 21, 2010 3:48 PM) CC: Cold   History of Present Illness: CC: cold  3d h/o chest congestion, laryngitis, hoarseness.  + coughing productive of yellowish green sputum.  + nasal congestion and rhinorrhea.  + HA.  Has tried mucinex, excedrin migraine.  tried son's albuterol treatment.    No abd pain, f/c, n/v/d, rashes, chest pain, SOB, myalgias, arthralgias  No smokers at home.  + wife sick as well with bronchitis.  remote smoking history.  No h/o asthma.  son with muscular dystrophy  Current Medications (verified): 1)  Propranolol Hcl 80 Mg Tabs (Propranolol Hcl) .... Take One By Mouth Two Times A Day 2)  Synthroid 50 Mcg Tabs (Levothyroxine Sodium) .... Take One By Mouth Once A Day 3)  Guaifenesin .... As Needed 4)  Maxzide-25 37.5-25 Mg  Tabs (Triamterene-Hctz) .... Take 1 Tablet By Mouth Every Morning 5)  Albuterol Inhalation Solution .Marland Kitchen.. 1 Hhneb Tx With Albuterol Q4h As Needed Cough 6)  Nexium 40 Mg  Cpdr (Esomeprazole Magnesium) .... Two Times A Day 7)  Omega Red With [place] .Marland Kitchen.. 1 Daily By Mouth 8)  Lipitor 10 Mg Tabs (Atorvastatin Calcium) .Marland Kitchen.. 1 By Mouth At Bedtime  Allergies: 1)  ! 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) SLEEP APNEA (ICD-780.57) CONTACT DERMATITIS&OTHER ECZEMA DUE DETERGENTS (ICD-692.0) HEALTH MAINTENANCE EXAM (ICD-V70.0) SCREENING FOR  MALIGNANT NEOPLASM, PROSTATE (ICD-V76.44) HYPERGLYCEMIA (ICD-790.29) RENAL CALCULUS (ICD-592.0) MIGRAINE HEADACHE (ICD-346.90) HYPERCHOLESTEROLEMIA/TRIG 226/267/131 (ICD-272.0) HX, FAMILY, MALIGNANCY, PROSTATE (YEARLY PSA'S) (ICD-V16.42) HYPOTHYROIDISM (ICD-244.9) HYPERTENSION (ICD-401.9)  Social History: Last updated: 12/10/2009 Marital Status: Married, since 1982 [employer] fan Children: One child at home with muscular dystrophy 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 No apparent alopecia or balding.  no sinus pain Eyes:  No corneal or conjunctival inflammation noted. EOMI. Perrla. Ears:  TMs clear bilaterally Nose:  pale nasal turbinates, not swollen Mouth:  Oral mucosa and oropharynx without lesions or exudates.  Teeth in good repair.  slight pharyngeal erythema Neck:  No deformities, masses, or tenderness noted.  no LAD Lungs:  Normal respiratory effort, chest expands symmetrically. Lungs are clear to auscultation, no crackles or wheezes. Heart:  Normal rate and regular rhythm. S1 and S2 normal without gallop, murmur, click, rub or other extra sounds. Pulses:  2+ rad pulses, brisk cap refill Extremities:  no pedal edema Skin:  intact without lesions or rashes   Impression & Recommendations:  Problem # 1:  VIRAL URI (ICD-465.9) only 3 days in, likely viral.  could be early sinusitis.  supportive care as per instructions.  refilled albuterol, tessalon and advised continued  mucinex.  sent in flonase as well as pt states this has helped in past..  His updated medication list for this problem includes:    Tessalon Perles  100 Mg Caps (Benzonatate) ..... One by mouth three times a day as needed cough  Complete Medication List: 1)  Propranolol Hcl 80 Mg Tabs (Propranolol hcl) .... Take one by mouth two times a day 2)  Synthroid 50 Mcg Tabs (Levothyroxine sodium) .... Take one by mouth once a day 3)  Guaifenesin  .... As needed 4)  Maxzide-25 37.5-25 Mg Tabs (Triamterene-hctz) .... Take 1 tablet by mouth every morning 5)  Nexium 40 Mg Cpdr (Esomeprazole magnesium) .... Two times a day 6)  Omega Red With [place]  .Marland Kitchen.. 1 daily by mouth 7)  Lipitor 10 Mg Tabs (Atorvastatin calcium) .Marland Kitchen.. 1 by mouth at bedtime 8)  Ventolin Hfa 108 (90 Base) Mcg/act Aers (Albuterol sulfate) .... 2 puffs q4-6 hours as needed cough/wheezing 9)  Tessalon Perles 100 Mg Caps (Benzonatate) .... One by mouth three times a day as needed cough 10)  Flonase 50 Mcg/act Susp (Fluticasone propionate) .... 2 squirts in each nostril in am  Patient Instructions: 1)  Sounds viral upper respiratory infection. 2)  Viral infections usually take 7-10 days to resolve.  The cough can last 4 weeks to go away. 3)  Use medication as prescribed: tessalon perls and albuterol, flonase as well. 4)  albuterol scheduled next 2-3 days. 5)  For laryngitis, have to wait it out.  Try herbal teas as well as sucking on hard candy. 6)  Please return if you are not improving as expected, or if you have high fevers (>101.5) or difficulty swallowing. 7)  Call clinic with questions.  Pleasure to see you today. Prescriptions: FLONASE 50 MCG/ACT SUSP (FLUTICASONE PROPIONATE) 2 squirts in each nostril in am  #1 x 1   Entered and Authorized by:   Eustaquio Boyden  MD   Signed by:   Eustaquio Boyden  MD on 06/21/2010   Method used:   [facility]        [id number]       [address]       [place], [place]  [address]       Ph: [phone] or [phone]       Fax: [phone]   RxID:   [id number] TESSALON PERLES 100 MG  CAPS (BENZONATATE) one by mouth three times a day as needed cough  #60 x 0   Entered and Authorized by:   Eustaquio Boyden  MD   Signed by:   Eustaquio Boyden  MD on 06/21/2010   Method used:   [facility]        [id number]       [address]       [place], [place]  [address]       Ph: [phone] or [phone]       Fax: [phone]   RxID:   [id number] VENTOLIN HFA 108 (90 BASE) MCG/ACT AERS (ALBUTEROL SULFATE) 2 puffs q4-6 hours as needed cough/wheezing  #1 x 3   Entered and Authorized by:   Eustaquio Boyden  MD   Signed by:   Eustaquio Boyden  MD on 06/21/2010   Method used:   [phone] [facility] (retail)       [address]       [place], [place]  [address]       Ph: [phone] or [phone]       Fax: [phone]   RxID:   [id number]    Orders Added: 1)  Est. Patient Level III [[address]]    Current Allergies (reviewed today): ! CODEINE SULFATE (CODEINE SULFATE)

## 2010-07-16 ENCOUNTER — Encounter: Payer: Self-pay | Admitting: Family Medicine

## 2010-07-16 ENCOUNTER — Ambulatory Visit (INDEPENDENT_AMBULATORY_CARE_PROVIDER_SITE_OTHER): Payer: BC Managed Care – PPO | Admitting: Family Medicine

## 2010-07-16 DIAGNOSIS — J019 Acute sinusitis, unspecified: Secondary | ICD-10-CM

## 2010-07-20 ENCOUNTER — Encounter: Payer: Self-pay | Admitting: Family Medicine

## 2010-07-20 ENCOUNTER — Ambulatory Visit (INDEPENDENT_AMBULATORY_CARE_PROVIDER_SITE_OTHER): Payer: BC Managed Care – PPO | Admitting: Family Medicine

## 2010-07-20 DIAGNOSIS — R05 Cough: Secondary | ICD-10-CM

## 2010-07-22 NOTE — Assessment & Plan Note (Signed)
Summary: ??sinus infection   Vital Signs:  Patient profile:   55 year old male Height:      67 inches Weight:      183.25 pounds BMI:     28.80 Temp:     98.4 degrees F oral Pulse rate:   72 / minute Pulse rhythm:   regular BP sitting:   120 / 80  (left arm) Cuff size:   large  Vitals Entered By: Delilah Shan CMA Duncan Dull) (July 16, 2010 9:36 AM) CC: ? sinus infection   History of Present Illness: He got better from illness in 1/12.  Sx restarted back up  ~1 week ago with sinus symptoms, cough, rhinorrhea.  No fevers.  Using tessalon, delsym w/o much relief.  Used the ventolin with help with the cough.  No sick contacts.  Flonase helps some with the nasal symptoms.  No NAV.    Allergies: 1)  ! Codeine Sulfate (Codeine Sulfate)  Social History: Reviewed history from 12/10/2009 and no changes required. 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  Otherwise negative.    Physical Exam  General:  GEN: nad, alert and oriented HEENT: mucous membranes moist, TM w/o erythema, nasal epithelium injected, OP with cobblestoning,frontal sinuses tender to palpation bilaterally NECK: supple w/o LA CV: rrr. PULM: ctab, no inc wob, dry cough noted EXT: no edema    Impression & Recommendations:  Problem # 1:  ACUTE SINUSITIS, UNSPECIFIED (ICD-461.9) Given duration and exam, I would start the zmax and use flonase.  He has the ventolin for as needed use.  His lungs are clear and he is nontoxic.  follow up as needed.  supportive tx o/w.  He agrees.  The following medications were removed from the medication list:    Amoxicillin 875 Mg Tabs (Amoxicillin) .Marland Kitchen... Take one by mouth two times a day x 10 days His updated medication list for  this problem includes:    Tessalon Perles 100 Mg Caps (Benzonatate) ..... One by mouth three times a day as needed cough    Flonase 50 Mcg/act Susp (Fluticasone propionate) .Marland Kitchen... 2 squirts in each nostril in am    Zithromax 250 Mg Tabs (Azithromycin) .Marland Kitchen... 2 by mouth today and then 1 by mouth once daily for 4 days  Orders: Prescription Created Electronically [phone])  Complete Medication List: 1)  Propranolol Hcl 80 Mg Tabs (Propranolol hcl) .... Take one by mouth two times a day 2)  Synthroid 50 Mcg Tabs (Levothyroxine sodium) .... Take one by mouth once a day 3)  Guaifenesin  .... As needed 4)  Maxzide-25 37.5-25 Mg Tabs (Triamterene-hctz) .... Take 1 tablet by mouth every morning 5)  Nexium 40 Mg Cpdr (Esomeprazole magnesium) .... Two times a day 6)  Omega Red With [place]  .Marland Kitchen.. 1 daily by mouth 7)  Lipitor 10 Mg Tabs (Atorvastatin calcium) .Marland Kitchen.. 1 by mouth at bedtime 8)  Ventolin Hfa 108 (90 Base) Mcg/act Aers (Albuterol sulfate) .... 2 puffs q4-6 hours as needed cough/wheezing 9)  Tessalon Perles 100 Mg Caps (Benzonatate) .... One by mouth three times a day as needed cough 10)  Flonase 50 Mcg/act Susp (Fluticasone propionate) .... 2 squirts in each nostril in am 11)  Zithromax 250 Mg  Tabs (Azithromycin) .... 2 by mouth today and then 1 by mouth once daily for 4 days  Patient Instructions: 1)  Start the antibiotics today.  Let me know if you aren't improving.  2)  Get plenty of rest, drink lots of clear liquids, and use Tylenol for fever and comfort. Keep using the albuterol and flonase as needed. 3)  Take care. Prescriptions: ZITHROMAX 250 MG TABS (AZITHROMYCIN) 2 by mouth today and then 1 by mouth once daily for 4 days  #6 x 0   Entered and Authorized by:   [name] MD   Signed by:   [name] MD on 07/16/2010   Method used:   [facility]        [facility] [address] [address]       [address]       [place], [place]  [address]       Ph: [phone] or  [phone]       Fax: [phone]   RxID:   [id number]    Orders Added: 1)  Prescription Created Electronically [G8553] 2)  Est. Patient Level III [[address]]    Current Allergies (reviewed today): ! CODEINE SULFATE (CODEINE SULFATE)

## 2010-07-27 NOTE — Assessment & Plan Note (Signed)
Summary: COUGH/RBH   Vital Signs:  Patient profile:   55 year old male Height:      67 inches Weight:      184 pounds BMI:     28.92 Temp:     98.6 degrees F oral Pulse rate:   84 / minute Pulse rhythm:   regular BP sitting:   126 / 80  (left arm) Cuff size:   large  Vitals Entered By: Delilah Shan CMA Yelena Metzer Dull) (July 20, 2010 10:45 AM) CC: Cough   History of Present Illness: Still with cough.  Prev note reviewed.  No fevers.  Coughing fits that come on.  No coughing to the point of vomiting.  Some sputum, yellow/green.  The cough isn't worse, but it isn't getting better.  No help with tessalon.  No facial pressure- better now.  Coughing until his sides hurt.  Son is sick at home.  Som wheeze, some relief with the SABA.  Minimal ST.  Voice change noted.  Took last dose of zmax today.   Allergies: 1)  ! Codeine Sulfate (Codeine Sulfate)  Review of Systems       See HPI.  Otherwise negative.    Physical Exam  General:  GEN: nad, alert and oriented HEENT: mucous membranes moist, TM w/o erythema, nasal epithelium less injected than prev, OP with mild cobblestoning,frontal sinuses not  tender to palpation bilaterally NECK: supple w/o LA CV: rrr. PULM: scattered ronchi, no inc wob, dry cough noted EXT: no edema    Impression & Recommendations:  Problem # 1:  COUGH (ICD-786.2) Will treat for bronchitis.  Will start augmentin.  He can't tolerate most cough meds.  With his wheezing, start the prednisone and use SABA as needed. Okay for outpatient follow up.  He agrees.  Orders: Prescription Created Electronically [phone])  Complete Medication List: 1)  Propranolol Hcl 80 Mg Tabs (Propranolol hcl) .... Take one by mouth two times a day 2)  Synthroid 50 Mcg Tabs (Levothyroxine sodium) .... Take one by mouth once a day 3)  Guaifenesin  .... As needed 4)  Maxzide-25 37.5-25 Mg Tabs (Triamterene-hctz) .... Take 1 tablet by mouth every morning 5)  Nexium 40 Mg Cpdr (Esomeprazole  magnesium) .... Two times a day 6)  Omega Red With [place]  .Marland Kitchen.. 1 daily by mouth 7)  Lipitor 10 Mg Tabs (Atorvastatin calcium) .Marland Kitchen.. 1 by mouth at bedtime 8)  Ventolin Hfa 108 (90 Base) Mcg/act Aers (Albuterol sulfate) .... 2 puffs q4-6 hours as needed cough/wheezing 9)  Tessalon Perles 100 Mg Caps (Benzonatate) .... One by mouth three times a day as needed cough 10)  Flonase 50 Mcg/act Susp (Fluticasone propionate) .... 2 squirts in each nostril in am 11)  Prednisone 20 Mg Tabs (Prednisone) .Marland Kitchen.. 1 by mouth once daily with food for 5 days. 12)  Augmentin 500-125 Mg Tabs (Amoxicillin-pot clavulanate) .Marland Kitchen.. 1 by mouth two times a day x10d  Patient Instructions: 1)  I would start the prednisone today.  Take it with food.  Start the augmentin and take it two times a day.  Use the inhaler.  This should gradually get better.  Let a doctor know if you get short of breath.  Take care.  Prescriptions: AUGMENTIN 500-125 MG TABS (AMOXICILLIN-POT CLAVULANATE) 1 by mouth two times a day x10d  #20 x 0   Entered and Authorized by:   [name] MD   Signed by:   [name] MD on 07/20/2010   Method used:  [facility]        [facility]       [address]       [place], [place]  [address]       Ph: [phone] or [phone]       Fax: [phone]   RxID:   [id number] PREDNISONE 20 MG TABS (PREDNISONE) 1 by mouth once daily with food for 5 days.  #5 x 0   Entered and Authorized by:   [name] MD   Signed by:   [name] MD on 07/20/2010   Method used:   [facility]        [facility] [address] [facility]ail)       [address]       [place], [place]  [address]       Ph: [phone] or [phone]       Fax: [phone]   RxID:   [id number]    Orders Added: 1)  Prescription Created Electronically [G8553] 2)  Est. Patient Level III [[address]]    Current Allergies (reviewed today): ! CODEINE SULFATE (CODEINE SULFATE)

## 2010-07-31 ENCOUNTER — Encounter: Payer: Self-pay | Admitting: Family Medicine

## 2010-10-01 NOTE — Procedures (Signed)
NAMEDAVISON, OHMS NO.:  [id number]   MEDICAL RECORD NO.:  [id number]          PATIENT TYPE:  OUT   LOCATION:  SLEEP CENTER                 FACILITY:  [facility]   PHYSICIAN:  Marcelyn Bruins, M.D. [facility] DATE OF BIRTH:  1956-03-20   DATE OF STUDY:  10/21/2004                              NOCTURNAL POLYSOMNOGRAM   REFERRING PHYSICIAN:  Marcelyn Bruins, MD   INDICATION FOR THE STUDY:  Hypersomnia with sleep apnea. Epworth score: 18.   SLEEP ARCHITECTURE:  The patient had a total sleep time of 362 minutes with  adequate REM, but decreased slow wave sleep. Sleep onset latency was normal  and REM onset was less than usual.   IMPRESSION:  1.  Mild obstructive sleep apnea/hypopnea syndrome with a respiratory      disturbance index of 13 events per hour and O2 desaturation as low as      87%. Events were more common in the supine position. Treatment for this      degree of some sleep apnea may include weight loss alone, upper airway      surgery, oral appliance and CPAP. Clinical correlation is suggested.  2.  Moderate to severe snoring noted throughout.  3.  Occasional premature ventricular complexes.     ______________________________  Suzzette Righter    KC/MEDQ  D:  10/25/2004 15:26:29  T:  10/25/2004 16:40:31  Job:  [id number]

## 2010-12-07 ENCOUNTER — Other Ambulatory Visit: Payer: Self-pay | Admitting: Family Medicine

## 2010-12-07 DIAGNOSIS — Z125 Encounter for screening for malignant neoplasm of prostate: Secondary | ICD-10-CM

## 2010-12-07 DIAGNOSIS — I10 Essential (primary) hypertension: Secondary | ICD-10-CM

## 2010-12-07 DIAGNOSIS — E039 Hypothyroidism, unspecified: Secondary | ICD-10-CM

## 2010-12-13 ENCOUNTER — Other Ambulatory Visit (INDEPENDENT_AMBULATORY_CARE_PROVIDER_SITE_OTHER): Payer: BC Managed Care – PPO | Admitting: Family Medicine

## 2010-12-13 DIAGNOSIS — E039 Hypothyroidism, unspecified: Secondary | ICD-10-CM

## 2010-12-13 DIAGNOSIS — I10 Essential (primary) hypertension: Secondary | ICD-10-CM

## 2010-12-13 DIAGNOSIS — Z125 Encounter for screening for malignant neoplasm of prostate: Secondary | ICD-10-CM

## 2010-12-13 LAB — COMPREHENSIVE METABOLIC PANEL
ALT: 40 U/L (ref 0–53)
AST: 27 U/L (ref 0–37)
Albumin: 4.4 g/dL (ref 3.5–5.2)
Alkaline Phosphatase: 32 U/L — ABNORMAL LOW (ref 39–117)
Calcium: 9.3 mg/dL (ref 8.4–10.5)
Chloride: 104 mEq/L (ref 96–112)
Potassium: 4.2 mEq/L (ref 3.5–5.1)
Sodium: 142 mEq/L (ref 135–145)
Total Protein: 6.7 g/dL (ref 6.0–8.3)

## 2010-12-13 LAB — LIPID PANEL
HDL: 35.4 mg/dL — ABNORMAL LOW (ref >39.00)
Total CHOL/HDL Ratio: 4
VLDL: 45.4 mg/dL — ABNORMAL HIGH (ref 0.0–40.0)

## 2010-12-16 ENCOUNTER — Ambulatory Visit (INDEPENDENT_AMBULATORY_CARE_PROVIDER_SITE_OTHER): Payer: BC Managed Care – PPO | Admitting: Family Medicine

## 2010-12-16 ENCOUNTER — Encounter: Payer: Self-pay | Admitting: Family Medicine

## 2010-12-16 DIAGNOSIS — E039 Hypothyroidism, unspecified: Secondary | ICD-10-CM

## 2010-12-16 DIAGNOSIS — R7309 Other abnormal glucose: Secondary | ICD-10-CM

## 2010-12-16 DIAGNOSIS — E785 Hyperlipidemia, unspecified: Secondary | ICD-10-CM

## 2010-12-16 DIAGNOSIS — Z8042 Family history of malignant neoplasm of prostate: Secondary | ICD-10-CM

## 2010-12-16 DIAGNOSIS — I10 Essential (primary) hypertension: Secondary | ICD-10-CM

## 2010-12-16 DIAGNOSIS — Z0000 Encounter for general adult medical examination without abnormal findings: Secondary | ICD-10-CM

## 2010-12-16 NOTE — Progress Notes (Signed)
CPE- See plan.  Routine anticipatory guidance given to patient.  See health maintenance.  Hypertension:    Using medication without problems or lightheadedness: yes Chest pain with exertion:no Edema:no Short of breath:no Average home BPs: usually 120s/80s.    Elevated Cholesterol: Using medications without problems:yes Muscle aches: no Other complaints:no  FH prostate CA.  PSA wnl.  Labs d/w pt.    PMH and SH reviewed  Meds, vitals, and allergies reviewed.   ROS: See HPI.  Otherwise negative.    GEN: nad, alert and oriented HEENT: mucous membranes moist NECK: supple w/o LA CV: rrr. PULM: ctab, no inc wob ABD: soft, +bs EXT: no edema SKIN: no acute rash Prostate gland firm and smooth, no enlargement, nodularity, tenderness, mass, asymmetry or induration.

## 2010-12-16 NOTE — Patient Instructions (Signed)
I would get a flu shot each fall.   Take care.  Let me know if you have concerns.  Glad to see you.  Try to use the treadmill and cut out the snack cakes.

## 2010-12-17 ENCOUNTER — Encounter: Payer: Self-pay | Admitting: Family Medicine

## 2010-12-17 DIAGNOSIS — Z0000 Encounter for general adult medical examination without abnormal findings: Secondary | ICD-10-CM | POA: Insufficient documentation

## 2010-12-17 DIAGNOSIS — Z8042 Family history of malignant neoplasm of prostate: Secondary | ICD-10-CM | POA: Insufficient documentation

## 2010-12-17 NOTE — Assessment & Plan Note (Signed)
BP controlled and labs d/w pt. D/w pt about diet and weight.

## 2010-12-17 NOTE — Assessment & Plan Note (Signed)
Healthy habits encouraged.  Up to date on psa/colon CA screening.   D/w pt about diet and weight.

## 2010-12-17 NOTE — Assessment & Plan Note (Signed)
Continue current meds.   D/w pt about diet and weight.

## 2010-12-17 NOTE — Assessment & Plan Note (Signed)
Normal exam and PSA wnl

## 2010-12-17 NOTE — Assessment & Plan Note (Signed)
Mild elevation.  D/w pt about diet and weight.

## 2010-12-17 NOTE — Assessment & Plan Note (Signed)
tsh wnl, no TMG on exam.  

## 2011-01-04 ENCOUNTER — Other Ambulatory Visit: Payer: Self-pay | Admitting: Family Medicine

## 2011-01-05 ENCOUNTER — Other Ambulatory Visit: Payer: Self-pay | Admitting: *Deleted

## 2011-01-05 MED ORDER — TRIAMTERENE-HCTZ 37.5-25 MG PO TABS: 1.0000 | ORAL_TABLET | Freq: Every day | ORAL | Status: DC

## 2011-01-18 ENCOUNTER — Encounter: Payer: Self-pay | Admitting: Pulmonary Disease

## 2011-02-08 ENCOUNTER — Other Ambulatory Visit: Payer: Self-pay | Admitting: Family Medicine

## 2011-03-23 ENCOUNTER — Ambulatory Visit (INDEPENDENT_AMBULATORY_CARE_PROVIDER_SITE_OTHER): Payer: BC Managed Care – PPO

## 2011-03-23 DIAGNOSIS — Z23 Encounter for immunization: Secondary | ICD-10-CM

## 2011-04-22 ENCOUNTER — Other Ambulatory Visit: Payer: Self-pay | Admitting: Family Medicine

## 2011-05-04 ENCOUNTER — Telehealth: Payer: Self-pay | Admitting: Internal Medicine

## 2011-05-04 NOTE — Telephone Encounter (Signed)
See if he can be added on tomorrow.  Thanks.

## 2011-05-04 NOTE — Telephone Encounter (Signed)
Patient advised.  Scheduled for 12 noon.

## 2011-05-04 NOTE — Telephone Encounter (Signed)
Patient called and stated he is having headaches, sinus draining, coughing up yellowish phelgm, and body aches.  The first available appointment isn't until Friday.  He wanted to know if you could call in an antibiotic.

## 2011-05-05 ENCOUNTER — Ambulatory Visit (INDEPENDENT_AMBULATORY_CARE_PROVIDER_SITE_OTHER): Payer: BC Managed Care – PPO | Admitting: Family Medicine

## 2011-05-05 ENCOUNTER — Encounter: Payer: Self-pay | Admitting: Family Medicine

## 2011-05-05 DIAGNOSIS — J329 Chronic sinusitis, unspecified: Secondary | ICD-10-CM

## 2011-05-05 MED ORDER — FLUTICASONE PROPIONATE 50 MCG/ACT NA SUSP: NASAL | Status: DC

## 2011-05-05 MED ORDER — ALBUTEROL SULFATE HFA 108 (90 BASE) MCG/ACT IN AERS: 1.0000 | INHALATION_SPRAY | Freq: Four times a day (QID) | RESPIRATORY_TRACT | Status: DC | PRN

## 2011-05-05 MED ORDER — AZITHROMYCIN 250 MG PO TABS: ORAL_TABLET | ORAL | Status: AC

## 2011-05-05 NOTE — Progress Notes (Signed)
duration of symptoms: 2-3 days ago rhinorrhea:yes congestion:yes ear pain:  no sore throat:yes Cough:yes, with postnasal gtt.  Myalgias: yes other concerns: headache- constant- facial pain, body aches, can't use cpap due to congestion No fevers  ROS: See HPI.  Otherwise negative.    Meds, vitals, and allergies reviewed.   GEN: nad, alert and oriented HEENT: mucous membranes moist, TM w/o erythema, nasal epithelium injected, OP with cobblestoning, Sinus ttp x4, purulent nasal discharge NECK: supple w/o LA CV: rrr. PULM: ctab, no inc wob ABD: soft, +bs EXT: no edema

## 2011-05-05 NOTE — Patient Instructions (Signed)
Use nasal saline and flonase.  Start the antibiotics today.  Drink plenty of fluids, take tylenol as needed, and gargle with warm salt water for your throat.  This should gradually improve.  Take care.  Let us know if you have other concerns.

## 2011-05-06 ENCOUNTER — Encounter: Payer: Self-pay | Admitting: Family Medicine

## 2011-05-06 DIAGNOSIS — J329 Chronic sinusitis, unspecified: Secondary | ICD-10-CM | POA: Insufficient documentation

## 2011-05-06 NOTE — Assessment & Plan Note (Signed)
Given the exam, start abx.  Use flonase, use SABA prn for cough.  Lungs ctab.  Okay for outpatient fu.

## 2011-07-05 ENCOUNTER — Encounter: Payer: Self-pay | Admitting: Family Medicine

## 2011-07-05 ENCOUNTER — Ambulatory Visit (INDEPENDENT_AMBULATORY_CARE_PROVIDER_SITE_OTHER): Payer: BC Managed Care – PPO | Admitting: Family Medicine

## 2011-07-05 VITALS — BP 116/84 | HR 84 | Temp 98.4°F | Wt 193.0 lb

## 2011-07-05 DIAGNOSIS — R05 Cough: Secondary | ICD-10-CM

## 2011-07-05 DIAGNOSIS — R059 Cough, unspecified: Secondary | ICD-10-CM

## 2011-07-05 MED ORDER — BENZONATATE 200 MG PO CAPS: 200.0000 mg | ORAL_CAPSULE | Freq: Three times a day (TID) | ORAL | Status: AC | PRN

## 2011-07-05 MED ORDER — AZITHROMYCIN 250 MG PO TABS: ORAL_TABLET | ORAL | Status: AC

## 2011-07-05 NOTE — Progress Notes (Signed)
Wife is sick and so is his son.  Stared about 1 week ago.  No fevers.  Cough, some sputum, green.  Some rhinorrhea.   No ear pain.  ST.  Chest hurst from coughing.  Unable to tolerate CPAP last night.  Usually uses CPAP.  Using SABA last few days with some relief.    Meds, vitals, and allergies reviewed.   ROS: See HPI.  Otherwise, noncontributory.  nad ncat Tm wnl  Nasal epithelium injected Op with cobblestoning Cough with deep breath, ronchi at [facility] cleared with cough.  No wheeze.  abd soft, not ttp Ext w/o edema

## 2011-07-05 NOTE — Patient Instructions (Signed)
Drink plenty of fluids, take tessalon for cough, start the antibiotics and gargle with warm salt water for your throat.  This should gradually improve.  Take care.  Let us know if you have other concerns.

## 2011-07-07 DIAGNOSIS — R05 Cough: Secondary | ICD-10-CM | POA: Insufficient documentation

## 2011-07-07 NOTE — Assessment & Plan Note (Signed)
Drink plenty of fluids, take tessalon for cough, start the zithromax and gargle with warm salt water for your throat. This should gradually improve.

## 2011-08-25 ENCOUNTER — Encounter: Payer: Self-pay | Admitting: Family Medicine

## 2011-08-25 DIAGNOSIS — M25519 Pain in unspecified shoulder: Secondary | ICD-10-CM | POA: Insufficient documentation

## 2011-11-12 ENCOUNTER — Other Ambulatory Visit: Payer: Self-pay | Admitting: Family Medicine

## 2011-11-14 NOTE — Telephone Encounter (Signed)
Clarify with patient and notify me.

## 2011-11-14 NOTE — Telephone Encounter (Signed)
?   If this patient is taking med as prescribed according to refills since 9/12.

## 2011-11-15 NOTE — Telephone Encounter (Signed)
Sent!

## 2011-11-15 NOTE — Telephone Encounter (Signed)
Patient says he has been taking it as prescribed.

## 2011-12-07 ENCOUNTER — Other Ambulatory Visit: Payer: Self-pay | Admitting: Family Medicine

## 2012-01-09 ENCOUNTER — Other Ambulatory Visit: Payer: Self-pay | Admitting: Family Medicine

## 2012-01-09 DIAGNOSIS — E039 Hypothyroidism, unspecified: Secondary | ICD-10-CM

## 2012-01-09 DIAGNOSIS — E7800 Pure hypercholesterolemia, unspecified: Secondary | ICD-10-CM

## 2012-01-09 DIAGNOSIS — Z8042 Family history of malignant neoplasm of prostate: Secondary | ICD-10-CM

## 2012-01-19 ENCOUNTER — Other Ambulatory Visit (INDEPENDENT_AMBULATORY_CARE_PROVIDER_SITE_OTHER): Payer: BC Managed Care – PPO

## 2012-01-19 DIAGNOSIS — E039 Hypothyroidism, unspecified: Secondary | ICD-10-CM

## 2012-01-19 DIAGNOSIS — Z8042 Family history of malignant neoplasm of prostate: Secondary | ICD-10-CM

## 2012-01-19 DIAGNOSIS — E7800 Pure hypercholesterolemia, unspecified: Secondary | ICD-10-CM

## 2012-01-19 LAB — PSA: PSA: 0.84 ng/mL (ref 0.10–4.00)

## 2012-01-19 LAB — COMPREHENSIVE METABOLIC PANEL
AST: 31 U/L (ref 0–37)
Albumin: 4.3 g/dL (ref 3.5–5.2)
Alkaline Phosphatase: 33 U/L — ABNORMAL LOW (ref 39–117)
Potassium: 4.5 mEq/L (ref 3.5–5.1)
Sodium: 139 mEq/L (ref 135–145)
Total Bilirubin: 0.7 mg/dL (ref 0.3–1.2)
Total Protein: 6.8 g/dL (ref 6.0–8.3)

## 2012-01-19 LAB — TSH: TSH: 3.4 u[IU]/mL (ref 0.35–5.50)

## 2012-01-19 LAB — LIPID PANEL
HDL: 33.7 mg/dL — ABNORMAL LOW (ref >39.00)
Triglycerides: 259 mg/dL — ABNORMAL HIGH (ref 0.0–149.0)
VLDL: 51.8 mg/dL — ABNORMAL HIGH (ref 0.0–40.0)

## 2012-01-24 ENCOUNTER — Ambulatory Visit (INDEPENDENT_AMBULATORY_CARE_PROVIDER_SITE_OTHER): Payer: BC Managed Care – PPO | Admitting: Family Medicine

## 2012-01-24 ENCOUNTER — Encounter: Payer: Self-pay | Admitting: Family Medicine

## 2012-01-24 VITALS — BP 142/80 | HR 79 | Temp 98.2°F | Ht 66.5 in | Wt 186.0 lb

## 2012-01-24 DIAGNOSIS — E039 Hypothyroidism, unspecified: Secondary | ICD-10-CM

## 2012-01-24 DIAGNOSIS — E785 Hyperlipidemia, unspecified: Secondary | ICD-10-CM

## 2012-01-24 DIAGNOSIS — Z0000 Encounter for general adult medical examination without abnormal findings: Secondary | ICD-10-CM

## 2012-01-24 DIAGNOSIS — I10 Essential (primary) hypertension: Secondary | ICD-10-CM

## 2012-01-24 DIAGNOSIS — G4730 Sleep apnea, unspecified: Secondary | ICD-10-CM

## 2012-01-24 DIAGNOSIS — R7309 Other abnormal glucose: Secondary | ICD-10-CM

## 2012-01-24 DIAGNOSIS — Z23 Encounter for immunization: Secondary | ICD-10-CM

## 2012-01-24 DIAGNOSIS — Z8042 Family history of malignant neoplasm of prostate: Secondary | ICD-10-CM

## 2012-01-24 MED ORDER — ESOMEPRAZOLE MAGNESIUM 40 MG PO CPDR: DELAYED_RELEASE_CAPSULE | ORAL | Status: DC

## 2012-01-24 MED ORDER — ATORVASTATIN CALCIUM 10 MG PO TABS: ORAL_TABLET | ORAL | Status: DC

## 2012-01-24 MED ORDER — PROPRANOLOL HCL 80 MG PO TABS: ORAL_TABLET | ORAL | Status: DC

## 2012-01-24 NOTE — Patient Instructions (Addendum)
Don't change your meds.  Keep working on your diet and try to walk more.  Take care.

## 2012-01-24 NOTE — Progress Notes (Signed)
CPE- See plan.  Routine anticipatory guidance given to patient.  See health maintenance. FH prostate cancer.  PSA wnl.  Colonoscopy 2011 Labs d/w pt.   Tetanus 2007 Flu shot yearly Advance directive encouraged.  He'll talk to his family.    Shoulder pain improved after injection per ortho.   Hyperglycemia.  Labs d/w pt.  D/w pt about diet and exercise.    Elevated Cholesterol: Using medications without problems: yes Muscle aches: no Diet compliance: as above Exercise: as above Other complaints:no  Hypertension:    Using medication without problems or lightheadedness: yes Chest pain with exertion:no Edema:no Short of breath:no Average home BPs: usually 120s/80-90.  Hypothyroid.  TSH wnl.  No dysphagia. No neck mass.    OSA on CPAP.  Compliant.  Sleeping well. No air leak.  Doing well.  Waking up refreshed.   PMH and SH reviewed  Meds, vitals, and allergies reviewed.   ROS: See HPI.  Otherwise negative.    GEN: nad, alert and oriented HEENT: mucous membranes moist NECK: supple w/o LA, no TMG CV: rrr. PULM: ctab, no inc wob ABD: soft, +bs EXT: no edema SKIN: no acute rash

## 2012-01-25 NOTE — Assessment & Plan Note (Signed)
PSA wnl

## 2012-01-25 NOTE — Assessment & Plan Note (Signed)
Routine anticipatory guidance given to patient.  See health maintenance. FH prostate cancer.  PSA wnl.  Colonoscopy 2011 Labs d/w pt.   Tetanus 2007 Flu shot yearly Advance directive encouraged.  He'll talk to his family.

## 2012-01-25 NOTE — Assessment & Plan Note (Signed)
Needs to work on weight/diet/exercise.  Continue current meds. Discussed.

## 2012-01-25 NOTE — Assessment & Plan Note (Signed)
Needs to work on weight/diet/exercise.  Continue current meds. Discussed.  

## 2012-01-25 NOTE — Assessment & Plan Note (Signed)
Controlled, continue current meds.   

## 2012-01-25 NOTE — Assessment & Plan Note (Signed)
Continue CPAP.  

## 2012-01-25 NOTE — Assessment & Plan Note (Signed)
Needs to work on weight/diet/exercise. Discussed.

## 2012-01-28 ENCOUNTER — Other Ambulatory Visit: Payer: Self-pay | Admitting: Family Medicine

## 2012-03-12 ENCOUNTER — Telehealth: Payer: Self-pay

## 2012-03-12 NOTE — Telephone Encounter (Signed)
Pt changing medical supply companies from [facility] to [facility]. Pt request order for c pap mask,mask head gear with tubing and filter for c pap machine faxed to [facility] at [phone]. Pt said he does not see pulmonologist that Dr Hetty Ely has ordered c pap supplies before.Please advise.

## 2012-03-12 NOTE — Telephone Encounter (Signed)
Please fax order for c pap mask,mask head gear with tubing and filter for c pap machine faxed to [facility] at [phone]

## 2012-03-13 ENCOUNTER — Encounter: Payer: Self-pay | Admitting: *Deleted

## 2012-03-13 NOTE — Telephone Encounter (Signed)
Order typed, signed and faxed to Fax number listed below.

## 2012-07-02 NOTE — Telephone Encounter (Addendum)
Adrian Dean with [facility] left v/m requesting most recent office notes; [facility] received order for c pap supplies in 02/2012. Adrian Dean said sleep study noted respiratory index 13. Need notes faxed to Adrian Dean's attention.Please advise.

## 2012-07-02 NOTE — Telephone Encounter (Signed)
Do we need to send our notes or do these need to come from a pulmonary evaluation?

## 2012-07-03 ENCOUNTER — Telehealth: Payer: Self-pay | Admitting: *Deleted

## 2012-07-03 NOTE — Telephone Encounter (Signed)
The 2006 sleep study is scanned.  They should be able to use that along with his current settings.  Thanks.

## 2012-07-03 NOTE — Telephone Encounter (Signed)
Send the 01/24/12 OV note. If they need more, they'll notify us.

## 2012-07-03 NOTE — Telephone Encounter (Signed)
Cecilia from [facility] says they do need a copy of the Sleep Study.  I faxed them the report that we have scanned but she left a message on my VM saying that this is not the complete report.  She says that [facility] does have a copy of the complete report but they must have a signed records release in order to give out this information.  Cecilia from [facility] is asking if we could get the patient to sign a release form and fax it to [facility] and then fax the report to her when we receive it?  [facility] Fax:  [phone]

## 2012-07-03 NOTE — Telephone Encounter (Signed)
I will absolutely do this but in my experience previously, they require a report of a sleep study, pressure recommendations, etc.

## 2012-07-04 NOTE — Telephone Encounter (Signed)
Please get the papers signed to release this.  Thanks.

## 2012-07-05 NOTE — Telephone Encounter (Signed)
Patient advised.  Will plan to come by the office on Friday, 07/06/12 to sign release of information.

## 2012-08-21 ENCOUNTER — Ambulatory Visit (INDEPENDENT_AMBULATORY_CARE_PROVIDER_SITE_OTHER): Payer: Managed Care, Other (non HMO) | Admitting: Family Medicine

## 2012-08-21 ENCOUNTER — Encounter: Payer: Self-pay | Admitting: Family Medicine

## 2012-08-21 VITALS — BP 122/84 | HR 66 | Temp 97.8°F | Wt 190.2 lb

## 2012-08-21 DIAGNOSIS — M79609 Pain in unspecified limb: Secondary | ICD-10-CM

## 2012-08-21 DIAGNOSIS — M79604 Pain in right leg: Secondary | ICD-10-CM

## 2012-08-21 DIAGNOSIS — J019 Acute sinusitis, unspecified: Secondary | ICD-10-CM

## 2012-08-21 MED ORDER — ALBUTEROL SULFATE HFA 108 (90 BASE) MCG/ACT IN AERS: 1.0000 | INHALATION_SPRAY | Freq: Four times a day (QID) | RESPIRATORY_TRACT | Status: DC | PRN

## 2012-08-21 MED ORDER — FLUTICASONE PROPIONATE 50 MCG/ACT NA SUSP: NASAL | Status: DC

## 2012-08-21 MED ORDER — DOXYCYCLINE HYCLATE 100 MG PO TABS: 100.0000 mg | ORAL_TABLET | Freq: Two times a day (BID) | ORAL | Status: DC

## 2012-08-21 NOTE — Progress Notes (Signed)
duration of symptoms:  ~5 days, sx aren't improving.   rhinorrhea:yes congestion:yes ear pain: no sore throat: mild Cough: yes, greenish sputum occ Myalgias: no No fevers Hadn't needed/used his SABA recently.   Started with URI sx and progressed to the chest sx with cough.  Deep breath doesn't trigger a cough.   Had a flu shot.   He has used some flonase with some relief.    For the last several months, he'll have burning along the R quad and shin with prolonged standing. Doesn't happen sitting. No rash.  No trauma. No back pain.  No leg weakness.   ROS: See HPI.  Otherwise negative.    Meds, vitals, and allergies reviewed.   GEN: nad, alert and oriented HEENT: mucous membranes moist, TM w/o erythema, nasal epithelium injected, OP with cobblestoning, max sinuses minimally ttp NECK: supple w/o LA CV: rrr. PULM: ctab, no inc wob, minimal dry cough ABD: soft, +bs EXT: no edema S/S/DTR wnl in BLE Back not ttp SLR neg

## 2012-08-21 NOTE — Patient Instructions (Addendum)
Use the nasal spray and the inhaler for now. If not improved in a few days, then start the antibiotics.   Use the stretches for your lower back and see if that helps with the burning in your leg. If not, then let me know.   Take care.

## 2012-08-22 DIAGNOSIS — J019 Acute sinusitis, unspecified: Secondary | ICD-10-CM | POA: Insufficient documentation

## 2012-08-22 DIAGNOSIS — M79606 Pain in leg, unspecified: Secondary | ICD-10-CM | POA: Insufficient documentation

## 2012-08-22 NOTE — Assessment & Plan Note (Signed)
Use the floase and SABA for now. If not improved in a few days, then start the doxycycline.  D/w pt.

## 2012-08-22 NOTE — Assessment & Plan Note (Signed)
Episodic dermatomal pain w/o rash. Likely from L spine source. No need to image today. Use the stretches for lower back - handout given.  If that doesn't help with the burning in his leg, he'll let me know.

## 2012-11-04 ENCOUNTER — Other Ambulatory Visit: Payer: Self-pay | Admitting: Family Medicine

## 2012-11-10 ENCOUNTER — Other Ambulatory Visit: Payer: Self-pay | Admitting: Family Medicine

## 2013-01-09 ENCOUNTER — Other Ambulatory Visit: Payer: Self-pay | Admitting: Family Medicine

## 2013-01-09 DIAGNOSIS — E785 Hyperlipidemia, unspecified: Secondary | ICD-10-CM

## 2013-01-09 DIAGNOSIS — Z125 Encounter for screening for malignant neoplasm of prostate: Secondary | ICD-10-CM

## 2013-01-09 DIAGNOSIS — E039 Hypothyroidism, unspecified: Secondary | ICD-10-CM

## 2013-01-17 ENCOUNTER — Other Ambulatory Visit (INDEPENDENT_AMBULATORY_CARE_PROVIDER_SITE_OTHER): Payer: Managed Care, Other (non HMO)

## 2013-01-17 DIAGNOSIS — Z125 Encounter for screening for malignant neoplasm of prostate: Secondary | ICD-10-CM

## 2013-01-17 DIAGNOSIS — I10 Essential (primary) hypertension: Secondary | ICD-10-CM

## 2013-01-17 DIAGNOSIS — E039 Hypothyroidism, unspecified: Secondary | ICD-10-CM

## 2013-01-17 DIAGNOSIS — E785 Hyperlipidemia, unspecified: Secondary | ICD-10-CM

## 2013-01-17 DIAGNOSIS — Z8042 Family history of malignant neoplasm of prostate: Secondary | ICD-10-CM

## 2013-01-17 DIAGNOSIS — R7309 Other abnormal glucose: Secondary | ICD-10-CM

## 2013-01-17 LAB — LIPID PANEL
Total CHOL/HDL Ratio: 4
Triglycerides: 287 mg/dL — ABNORMAL HIGH (ref 0.0–149.0)

## 2013-01-17 LAB — COMPREHENSIVE METABOLIC PANEL
ALT: 30 U/L (ref 0–53)
AST: 22 U/L (ref 0–37)
Albumin: 4.1 g/dL (ref 3.5–5.2)
Alkaline Phosphatase: 30 U/L — ABNORMAL LOW (ref 39–117)
BUN: 16 mg/dL (ref 6–23)
Calcium: 9.6 mg/dL (ref 8.4–10.5)
Chloride: 101 mEq/L (ref 96–112)
Potassium: 4.4 mEq/L (ref 3.5–5.1)
Sodium: 137 mEq/L (ref 135–145)
Total Protein: 6.3 g/dL (ref 6.0–8.3)

## 2013-01-17 LAB — LDL CHOLESTEROL, DIRECT: Direct LDL: 60.6 mg/dL

## 2013-01-17 LAB — PSA: PSA: 0.67 ng/mL (ref 0.10–4.00)

## 2013-01-17 LAB — TSH: TSH: 2.7 u[IU]/mL (ref 0.35–5.50)

## 2013-01-24 ENCOUNTER — Ambulatory Visit (INDEPENDENT_AMBULATORY_CARE_PROVIDER_SITE_OTHER): Payer: Managed Care, Other (non HMO) | Admitting: Family Medicine

## 2013-01-24 ENCOUNTER — Encounter: Payer: Self-pay | Admitting: Family Medicine

## 2013-01-24 VITALS — BP 122/80 | HR 88 | Temp 98.2°F | Wt 186.2 lb

## 2013-01-24 DIAGNOSIS — L989 Disorder of the skin and subcutaneous tissue, unspecified: Secondary | ICD-10-CM

## 2013-01-24 DIAGNOSIS — R7309 Other abnormal glucose: Secondary | ICD-10-CM

## 2013-01-24 DIAGNOSIS — Z0000 Encounter for general adult medical examination without abnormal findings: Secondary | ICD-10-CM

## 2013-01-24 DIAGNOSIS — K219 Gastro-esophageal reflux disease without esophagitis: Secondary | ICD-10-CM

## 2013-01-24 DIAGNOSIS — E039 Hypothyroidism, unspecified: Secondary | ICD-10-CM

## 2013-01-24 DIAGNOSIS — Z23 Encounter for immunization: Secondary | ICD-10-CM

## 2013-01-24 DIAGNOSIS — E785 Hyperlipidemia, unspecified: Secondary | ICD-10-CM

## 2013-01-24 DIAGNOSIS — I10 Essential (primary) hypertension: Secondary | ICD-10-CM

## 2013-01-24 MED ORDER — PROPRANOLOL HCL 80 MG PO TABS: ORAL_TABLET | ORAL | Status: DC

## 2013-01-24 MED ORDER — ESOMEPRAZOLE MAGNESIUM 40 MG PO CPDR: DELAYED_RELEASE_CAPSULE | ORAL | Status: DC

## 2013-01-24 MED ORDER — ATORVASTATIN CALCIUM 10 MG PO TABS: ORAL_TABLET | ORAL | Status: DC

## 2013-01-24 NOTE — Patient Instructions (Addendum)
Think about getting a living will.  Take care. Glad to see you.  I would get a flu shot each fall.   If the spot in your nose doesn't get better, then let me know.

## 2013-01-24 NOTE — Progress Notes (Signed)
CPE- See plan.  Routine anticipatory guidance given to patient.  See health maintenance. Wife found her mother dead.  Buried mother in law yesterday.  I offered my condolences.  Advance directive encouraged. He'll talk to his family.  Tetanus 2007 Flu shot  Colonoscopy 2011 PSA wnl.  D/w pt.  Diet and exercise.  D/w pt.  Encouraged.  Both "ain't doing good."  Mainly due to long hours at work.   Hypertension:    Using medication without problems: yes Chest pain with exertion:no Edema:no Short of breath:no  Hypothyroid.  No ADE, compliant. No lumps, neck mass, dysphagia.    Elevated Cholesterol: Using medications without problems:yes Muscle aches: no Diet compliance: see above Exercise: see above  Heartburn/GERD controlled with current meds. No ADE.    PMH and SH reviewed  Meds, vitals, and allergies reviewed.   ROS: See HPI.  Otherwise negative.    GEN: nad, alert and oriented HEENT: mucous membranes moist NECK: supple w/o LA, no TMG CV: rrr. PULM: ctab, no inc wob ABD: soft, +bs EXT: no edema SKIN: no acute rash but small white warty papule on medial side of L distal nostril.

## 2013-01-25 DIAGNOSIS — K219 Gastro-esophageal reflux disease without esophagitis: Secondary | ICD-10-CM | POA: Insufficient documentation

## 2013-01-25 DIAGNOSIS — L989 Disorder of the skin and subcutaneous tissue, unspecified: Secondary | ICD-10-CM | POA: Insufficient documentation

## 2013-01-25 NOTE — Assessment & Plan Note (Signed)
Controlled Continue current meds 

## 2013-01-25 NOTE — Assessment & Plan Note (Signed)
Labs d/w pt. Needs to work on diet and weight.   Continue current meds.

## 2013-01-25 NOTE — Assessment & Plan Note (Signed)
Labs d/w pt. Needs to work on diet and weight.   Continue current meds.  

## 2013-01-25 NOTE — Assessment & Plan Note (Signed)
Labs d/w pt. controlled.   Continue current meds.

## 2013-01-25 NOTE — Assessment & Plan Note (Signed)
Labs d/w pt. Needs to work on diet and weight.

## 2013-01-25 NOTE — Assessment & Plan Note (Signed)
Looks to be an irritated wart. Present a few months.  Treated x3 with Liq N2, verbal consent obtained.  Tolerated well.  If not resolved, then patient will notify us. No ulceration.  He agrees with plan.

## 2013-01-25 NOTE — Assessment & Plan Note (Signed)
Routine anticipatory guidance given to patient.  See health maintenance. Wife found her mother dead.  Buried mother in law yesterday.  I offered my condolences.  Advance directive encouraged. He'll talk to his family.  Tetanus 2007 Flu shot  Colonoscopy 2011 PSA wnl.  D/w pt.  Diet and exercise.  D/w pt.  Encouraged.  Both "ain't doing good."  Mainly due to long hours at work.

## 2013-02-25 ENCOUNTER — Ambulatory Visit: Payer: Managed Care, Other (non HMO) | Admitting: Family Medicine

## 2013-02-26 ENCOUNTER — Encounter: Payer: Self-pay | Admitting: Family Medicine

## 2013-02-26 ENCOUNTER — Ambulatory Visit (INDEPENDENT_AMBULATORY_CARE_PROVIDER_SITE_OTHER): Payer: Managed Care, Other (non HMO) | Admitting: Family Medicine

## 2013-02-26 VITALS — BP 120/88 | HR 74 | Temp 98.0°F | Wt 185.0 lb

## 2013-02-26 DIAGNOSIS — L989 Disorder of the skin and subcutaneous tissue, unspecified: Secondary | ICD-10-CM

## 2013-02-26 NOTE — Patient Instructions (Signed)
Update me in about 10 days.   If not improving, we can set you up with ENT.  Take care.

## 2013-02-27 NOTE — Assessment & Plan Note (Signed)
Treated again with liq N2 x3, no complications.  If not resolving, we can refer to ENT.  He'll call back if needed.  Still likely a benign lesion and no cause for alarm based on exam.  He agrees.

## 2013-02-27 NOTE — Progress Notes (Signed)
Warty lesion in L nostril didn't resolve with liq N2 tx prev.  Tolerated well, but not resolved.  Here for eval again.   Meds, vitals, and allergies reviewed.   ROS: See HPI.  Otherwise, noncontributory.  ncat Nasal exam wnl except for small warty lesion on the L nostril, medial.  No ulceration or other mass.

## 2013-03-28 ENCOUNTER — Telehealth: Payer: Self-pay | Admitting: Family Medicine

## 2013-03-28 DIAGNOSIS — J3489 Other specified disorders of nose and nasal sinuses: Secondary | ICD-10-CM

## 2013-03-28 NOTE — Telephone Encounter (Signed)
Pt called and wants ENT referral that you talked about at last office visit.

## 2013-03-29 NOTE — Telephone Encounter (Signed)
Done.  Thanks.  Adrian Dean will call about the referral.

## 2013-04-08 ENCOUNTER — Other Ambulatory Visit: Payer: Self-pay | Admitting: Otolaryngology

## 2013-05-23 ENCOUNTER — Telehealth: Payer: Self-pay | Admitting: Family Medicine

## 2013-05-23 NOTE — Telephone Encounter (Signed)
Noted agree

## 2013-05-23 NOTE — Telephone Encounter (Signed)
Patient Information:  Caller Name: Bowen  Phone: [phone]  Patient: Adrian Dean, Adrian Dean  Gender: Male  DOB: 1956-04-03  Age: 58 Years  PCP: Elsie Stain Brigitte Pulse) [facility])  Office Follow Up:  Does the office need to follow up with this patient?: No  Instructions For The Office: N/A  RN Note:  Tommi Rumps; currently at work. Offered and declined appointment.  Symptoms  Reason For Call & Symptoms: Clear rhinorrhea,  green productive cough.  Reviewed Health History In EMR: Yes  Reviewed Medications In EMR: Yes  Reviewed Allergies In EMR: Yes  Reviewed Surgeries / Procedures: Yes  Date of Onset of Symptoms: 05/21/2013  Treatments Tried: Mucinex, Ibuprofen  Treatments Tried Worked: No  Guideline(s) Used:  Cough  Disposition Per Guideline:   Home Care  Reason For Disposition Reached:   Cough with no complications  Advice Given:  Reassurance  Coughing is the way that our lungs remove irritants and mucus. It helps protect our lungs from getting pneumonia.  You can also get a cough after being exposed to irritating substances like smoke, strong perfumes, and dust.  Cough Medicines:  OTC Cough Syrups: The most common cough suppressant in OTC cough medications is dextromethorphan. Often the letters "DM" appear in the name.  Home Remedy - Hard Candy: Hard candy works just as well as medicine-flavored OTC cough drops. Diabetics should use sugar-free candy.  Home Remedy - Honey: This old home remedy has been shown to help decrease coughing at night. The adult dosage is 2 teaspoons (10 ml) at bedtime. Honey should not be given to infants under one year of age.  OTC Cough Syrup - Dextromethorphan:  Cough syrups containing the cough suppressant dextromethorphan (DM) may help decrease your cough. Cough syrups work best for coughs that keep you awake at night. They can also sometimes help in the late stages of a respiratory infection when the cough is dry and hacking. They can be used  along with cough drops.  Examples: Benylin, Robitussin DM, Vicks 44 Cough Relief  Coughing Spasms:  Drink warm fluids. Inhale warm mist (Reason: both relax the airway and loosen up the phlegm).  Suck on cough drops or hard candy to coat the irritated throat.  Prevent Dehydration:  Drink adequate liquids.  This will help soothe an irritated or dry throat and loosen up the phlegm.  Expected Course:   The expected course depends on what is causing the cough.  Viral bronchitis (chest cold) causes a cough that lasts 1 to 3 weeks. Sometimes you may cough up lots of phlegm (sputum, mucus). The mucus can normally be white, gray, yellow, or green.  Call Back If:  Difficulty breathing  Cough lasts more than 3 weeks  Fever lasts > 3 days  You become worse.  Caution - Nasal Decongestants:  Do not take these medications if you have high blood pressure, heart disease, prostate problems, or an overactive thyroid.  Contagiousness:  The cold virus is present in your nasal secretions.  Cover your nose and mouth with a tissue when you sneeze or cough.  Wash your hands frequently with soap and water.  You can return to work or school after the fever is gone and you feel well enough to participate in normal activities.  Patient Will Follow Care Advice:  YES

## 2013-10-13 ENCOUNTER — Other Ambulatory Visit: Payer: Self-pay | Admitting: Family Medicine

## 2013-11-04 ENCOUNTER — Other Ambulatory Visit: Payer: Self-pay | Admitting: Family Medicine

## 2013-12-15 ENCOUNTER — Other Ambulatory Visit: Payer: Self-pay | Admitting: Family Medicine

## 2013-12-16 NOTE — Telephone Encounter (Signed)
Patient advised.

## 2013-12-16 NOTE — Telephone Encounter (Signed)
Received refill request electronically from pharmacy. Last refill 11/04/13 #30, advised needed to be seen for further refills. Last office visit 02/26/13/acute visit. No appointment scheduled. Is it okay to refill medication?

## 2013-12-16 NOTE — Telephone Encounter (Signed)
Try to get CPE scheduled for the fall. Thanks. Sent.

## 2013-12-20 ENCOUNTER — Ambulatory Visit (INDEPENDENT_AMBULATORY_CARE_PROVIDER_SITE_OTHER): Payer: Managed Care, Other (non HMO) | Admitting: Family Medicine

## 2013-12-20 ENCOUNTER — Telehealth: Payer: Self-pay | Admitting: Family Medicine

## 2013-12-20 DIAGNOSIS — Z23 Encounter for immunization: Secondary | ICD-10-CM

## 2013-12-20 NOTE — Telephone Encounter (Signed)
Patient Information:  Caller Name: Dickey  Phone: [phone]  Patient: Adrian Dean, Adrian Dean  Gender: Male  DOB: 06-Sep-1955  Age: 58 Years  PCP: Elsie Stain Brigitte Pulse) [facility])  Office Follow Up:  Does the office need to follow up with this patient?: Yes  Instructions For The Office: Wondering if he could just get a Td vaccine today; not a MD visit; please call back   Symptoms  Reason For Call & Symptoms: calling regarding last Td vaccine; says he was using a grinder this am that hit him on his left side and cut him; looks like an abrasion; cleaned with Betadine; abx ointment applied with a bandaid; checked epic and last Td 01/05/06  Reviewed Health History In EMR: Yes  Reviewed Medications In EMR: Yes  Reviewed Allergies In EMR: Yes  Reviewed Surgeries / Procedures: Yes  Date of Onset of Symptoms: 12/20/2013  Guideline(s) Used:  Skin Injury  Disposition Per Guideline:   See Today or Tomorrow in Office  Reason For Disposition Reached:   No tetanus booster in > 5 years (Or greater than 10 years for clean wounds)  Advice Given:  N/A  Patient Will Follow Care Advice:  YES

## 2013-12-22 NOTE — Telephone Encounter (Signed)
Tdap given.  Wound inspected.  Linear abrasion with a very narrow and very shallow lac at the midpoint, ~1cm. Not bleeding, doesn't appear to need suture.  No FB seen. Cleaned and bandaged, with neosporin.  Fu prn.  Should heal.

## 2013-12-22 NOTE — Progress Notes (Signed)
See following phone note. 

## 2014-01-07 ENCOUNTER — Other Ambulatory Visit: Payer: Self-pay | Admitting: Family Medicine

## 2014-01-21 ENCOUNTER — Other Ambulatory Visit: Payer: Self-pay | Admitting: Family Medicine

## 2014-01-21 DIAGNOSIS — Z125 Encounter for screening for malignant neoplasm of prostate: Secondary | ICD-10-CM

## 2014-01-21 DIAGNOSIS — I10 Essential (primary) hypertension: Secondary | ICD-10-CM

## 2014-01-23 ENCOUNTER — Other Ambulatory Visit: Payer: Managed Care, Other (non HMO)

## 2014-01-24 ENCOUNTER — Other Ambulatory Visit (INDEPENDENT_AMBULATORY_CARE_PROVIDER_SITE_OTHER): Payer: Managed Care, Other (non HMO)

## 2014-01-24 DIAGNOSIS — E039 Hypothyroidism, unspecified: Secondary | ICD-10-CM

## 2014-01-24 DIAGNOSIS — R7309 Other abnormal glucose: Secondary | ICD-10-CM

## 2014-01-24 DIAGNOSIS — Z125 Encounter for screening for malignant neoplasm of prostate: Secondary | ICD-10-CM

## 2014-01-24 DIAGNOSIS — I10 Essential (primary) hypertension: Secondary | ICD-10-CM

## 2014-01-24 DIAGNOSIS — E785 Hyperlipidemia, unspecified: Secondary | ICD-10-CM

## 2014-01-24 LAB — COMPREHENSIVE METABOLIC PANEL
ALK PHOS: 32 U/L — AB (ref 39–117)
ALT: 26 U/L (ref 0–53)
AST: 23 U/L (ref 0–37)
Albumin: 4.1 g/dL (ref 3.5–5.2)
BILIRUBIN TOTAL: 0.6 mg/dL (ref 0.2–1.2)
BUN: 20 mg/dL (ref 6–23)
CO2: 29 meq/L (ref 19–32)
Calcium: 9.6 mg/dL (ref 8.4–10.5)
Chloride: 101 mEq/L (ref 96–112)
Creatinine, Ser: 1.1 mg/dL (ref 0.4–1.5)
GFR: 74.17 mL/min (ref >60.00)
Glucose, Bld: 107 mg/dL — ABNORMAL HIGH (ref 70–99)
Potassium: 3.8 mEq/L (ref 3.5–5.1)
SODIUM: 138 meq/L (ref 135–145)
Total Protein: 6.5 g/dL (ref 6.0–8.3)

## 2014-01-24 LAB — LIPID PANEL
CHOL/HDL RATIO: 5
Cholesterol: 137 mg/dL (ref 0–200)
HDL: 28.1 mg/dL — ABNORMAL LOW (ref >39.00)
NonHDL: 108.9
Triglycerides: 254 mg/dL — ABNORMAL HIGH (ref 0.0–149.0)
VLDL: 50.8 mg/dL — AB (ref 0.0–40.0)

## 2014-01-24 LAB — LDL CHOLESTEROL, DIRECT: LDL DIRECT: 68.2 mg/dL

## 2014-01-24 LAB — PSA, MEDICARE: PSA: 1.66 ng/mL (ref 0.10–4.00)

## 2014-01-24 LAB — TSH: TSH: 2.96 u[IU]/mL (ref 0.35–4.50)

## 2014-01-30 ENCOUNTER — Encounter: Payer: Self-pay | Admitting: Family Medicine

## 2014-01-30 ENCOUNTER — Ambulatory Visit (INDEPENDENT_AMBULATORY_CARE_PROVIDER_SITE_OTHER): Payer: Managed Care, Other (non HMO) | Admitting: Family Medicine

## 2014-01-30 VITALS — BP 124/78 | HR 72 | Temp 98.4°F | Ht 66.0 in | Wt 183.5 lb

## 2014-01-30 DIAGNOSIS — E039 Hypothyroidism, unspecified: Secondary | ICD-10-CM

## 2014-01-30 DIAGNOSIS — Z0000 Encounter for general adult medical examination without abnormal findings: Secondary | ICD-10-CM

## 2014-01-30 DIAGNOSIS — E785 Hyperlipidemia, unspecified: Secondary | ICD-10-CM

## 2014-01-30 DIAGNOSIS — R7309 Other abnormal glucose: Secondary | ICD-10-CM

## 2014-01-30 DIAGNOSIS — Z7189 Other specified counseling: Secondary | ICD-10-CM

## 2014-01-30 DIAGNOSIS — Z23 Encounter for immunization: Secondary | ICD-10-CM

## 2014-01-30 DIAGNOSIS — I10 Essential (primary) hypertension: Secondary | ICD-10-CM

## 2014-01-30 MED ORDER — ESOMEPRAZOLE MAGNESIUM 40 MG PO CPDR: DELAYED_RELEASE_CAPSULE | ORAL | Status: DC

## 2014-01-30 MED ORDER — TRIAMTERENE-HCTZ 37.5-25 MG PO TABS: ORAL_TABLET | ORAL | Status: DC

## 2014-01-30 MED ORDER — LEVOTHYROXINE SODIUM 50 MCG PO TABS: ORAL_TABLET | ORAL | Status: DC

## 2014-01-30 MED ORDER — ATORVASTATIN CALCIUM 10 MG PO TABS: ORAL_TABLET | ORAL | Status: DC

## 2014-01-30 MED ORDER — PROPRANOLOL HCL 80 MG PO TABS: ORAL_TABLET | ORAL | Status: DC

## 2014-01-30 NOTE — Patient Instructions (Addendum)
It looks like you have a pterygium on your eyes- use can use artificial tears if needed.  If worsening, then notify the eye clinic.  Take care. Glad to see you.  Recheck at a physical in about 1 year.

## 2014-01-30 NOTE — Progress Notes (Signed)
Pre visit review using our clinic review tool, if applicable. No additional management support is needed unless otherwise documented below in the visit note.  CPE- See plan.  Routine anticipatory guidance given to patient.  See health maintenance. Tetanus 2015 Flu shot 2015 PNA and shingles not due yet, d/w pt.  PSA wnl Colonoscopy 2011 Living will d/w pt.  Wife designated if patient were incapacitated.  Diet and exercise d/w pt.  Diet is better and walking for exercise.   Elevated Cholesterol: Using medications without problems:yes Muscle aches: not from statin, likely from work Diet compliance:yes Exercise:yes Labs d/w pt.   Hypertension:    Using medication without problems or lightheadedness: yes Chest pain with exertion:no Edema:no Short of breath:no  Hypothyroid.  TSH wnl, no neck mass, no dysphagia.  Labs d/w pt.   PMH and SH reviewed  Meds, vitals, and allergies reviewed.   ROS: See HPI.  Otherwise negative.    GEN: nad, alert and oriented HEENT: mucous membranes moist, PERRL but pterygium noted on the L sides of the eyes B- d/w pt.   NECK: supple w/o LA, no TMG CV: rrr. PULM: ctab, no inc wob ABD: soft, +bs EXT: no edema SKIN: no acute rash

## 2014-01-31 DIAGNOSIS — Z7189 Other specified counseling: Secondary | ICD-10-CM | POA: Insufficient documentation

## 2014-01-31 NOTE — Assessment & Plan Note (Signed)
Routine anticipatory guidance given to patient.  See health maintenance. Tetanus 2015 Flu shot 2015 PNA and shingles not due yet, d/w pt.  PSA wnl Colonoscopy 2011 Living will d/w pt.  Wife designated if patient were incapacitated.  Diet and exercise d/w pt.  Diet is better and walking for exercise.

## 2014-01-31 NOTE — Assessment & Plan Note (Signed)
See above. He'll work on weight.  Labs d/w pt.

## 2014-01-31 NOTE — Assessment & Plan Note (Signed)
Controlled, no change in meds.  Continue work on D&E.  He agrees.  Labs d/w pt.

## 2014-01-31 NOTE — Assessment & Plan Note (Signed)
No change in meds.  He/ll work on D&E, d/w pt about TG and sugar.  He agrees.

## 2014-01-31 NOTE — Assessment & Plan Note (Signed)
TSH wnl, continue as is.  Doing well.  D/w pt.  He agrees.

## 2014-02-25 ENCOUNTER — Telehealth: Payer: Self-pay | Admitting: Family Medicine

## 2014-02-25 NOTE — Telephone Encounter (Signed)
I am sensitive to his consideration at home and work.  Given the timeline, though, it likely wouldn't make sense to start abx at this point.  I would give it a few more days and then update [place] if needed.  Thanks.

## 2014-02-25 NOTE — Telephone Encounter (Signed)
Patient Information:  Caller Name: Adrian Dean  Phone: [phone]  Patient: Adrian, Dean  Gender: Male  DOB: 1956-03-19  Age: 58 Years  PCP: Elsie Stain Brigitte Pulse) [facility])  Office Follow Up:  Does the office need to follow up with this patient?: Yes  Instructions For The Office: His son has Muscular dystrophy and is concerned about exposure.  He cannot make office appt . He is working double shifts. Asking for Rx medication.  Aware of office policy regarding Antibiotics.  [facility].  RN Note:  His son has Muscular dystrophy and is concerned about exposure.  He cannot make office appt . He is working double shifts. Asking for Rx medication.  Aware of office policy regarding Antibiotics.  [facility] [place].  Symptoms  Reason For Call & Symptoms: Patient is calling requesting an antiboitic. Onset of illness Saturday 02/22/14,  +runny nose, nasal drainge, sinus pressure with  watery eyes, +cough productive - yellow. scratchy throat but not sore.   Afebrile.  Reviewed Health History In EMR: Yes  Reviewed Medications In EMR: Yes  Reviewed Allergies In EMR: Yes  Reviewed Surgeries / Procedures: Yes  Date of Onset of Symptoms: 02/22/2014  Treatments Tried: Mucinex, Ibuprofen  Treatments Tried Worked: No  Guideline(s) Used:  Cough  Sinus Pain and Congestion  Disposition Per Guideline:   See Today or Tomorrow in Office  Reason For Disposition Reached:   Lots of coughing  Advice Given:  For a Runny Nose With Profuse Discharge:  Nasal mucus and discharge helps to wash viruses and bacteria out of the nose and sinuses.  Blowing the nose is all that is needed.  For a Stuffy Nose - Use Nasal Washes:  How it Helps: The salt water rinses out excess mucus, washes out any irritants (dust, allergens) that might be present, and moistens the nasal cavity.  Methods: There are several ways to perform nasal irrigation. You can use a saline nasal spray bottle  (available over-the-counter), a rubber ear syringe, a medical syringe without the needle, or a Neti Pot.  Pain and Fever Medicines:  For pain or fever relief, take either acetaminophen or ibuprofen.  They are over-the-counter (OTC) drugs that help treat both fever and pain. You can buy them at the drugstore.  Treat fevers above 101 F (38.3 C). The goal of fever therapy is to bring the fever down to a comfortable level. Remember that fever medicine usually lowers fever 2 degrees F (1 - 1 1/2 degrees C).  Acetaminophen (e.g., Tylenol):  Regular Strength Tylenol: Take 650 mg (two 325 mg pills) by mouth every 4-6 hours as needed. Each Regular Strength Tylenol pill has 325 mg of acetaminophen.  Ibuprofen (e.g., Motrin, Advil):  Take 400 mg (two 200 mg pills) by mouth every 6 hours.  Hydration:  Drink plenty of liquids (6-8 glasses of water daily). If the air in your home is dry, use a cool mist humidifier  Call Back If:   Severe pain lasts longer than 2 hours after pain medicine  Sinus pain lasts longer than 1 day after starting treatment using nasal washes  Sinus congestion (fullness) lasts longer than 10 days  Fever lasts longer than 3 days  You become worse.  RN Overrode Recommendation:  Patient Requests Prescription  His son has Muscular dystrophy and is concerned about exposure.  He cannot make office appt . He is working double shifts. Asking for Rx medication.  Aware of office policy regarding Antibiotics.  [facility] [place]  [place].

## 2014-02-25 NOTE — Telephone Encounter (Signed)
Patient notified as instructed by telephone.  Patient will call back in a few days if needed.

## 2014-04-25 ENCOUNTER — Telehealth: Payer: Self-pay | Admitting: Family Medicine

## 2014-04-25 NOTE — Telephone Encounter (Signed)
Pt is requesting an order for CPAP supplies, last order was 03/13/2012 but the med supply company needs an order that is dated more recently. Fax # [phone] [facility]

## 2014-04-27 NOTE — Telephone Encounter (Signed)
Please fax order for c pap mask,mask head gear with tubing and filter for c pap machine.  DX [facility].  Thanks.

## 2014-04-28 ENCOUNTER — Encounter: Payer: Self-pay | Admitting: *Deleted

## 2014-04-28 NOTE — Telephone Encounter (Signed)
Order faxed.

## 2014-06-10 ENCOUNTER — Telehealth: Payer: Self-pay

## 2014-06-10 NOTE — Telephone Encounter (Signed)
Pt has changed ins co and nexium is not covered; Omeprazole and dexilant are covered substitute meds for nexium. Pt request sub med [facility] to [facility].Please advise.

## 2014-06-11 MED ORDER — OMEPRAZOLE 40 MG PO CPDR: 40.0000 mg | DELAYED_RELEASE_CAPSULE | Freq: Two times a day (BID) | ORAL | Status: DC

## 2014-06-11 NOTE — Telephone Encounter (Signed)
Sent. Thanks.   

## 2014-06-11 NOTE — Telephone Encounter (Signed)
Patient notified that script has been sent.

## 2014-08-04 NOTE — Telephone Encounter (Signed)
Pt left v/m requesting generic nexium sent to [facility]; Omeprazole is not effective. Pt request cb.

## 2014-08-05 ENCOUNTER — Telehealth: Payer: Self-pay | Admitting: Family Medicine

## 2014-08-05 ENCOUNTER — Other Ambulatory Visit: Payer: Self-pay | Admitting: *Deleted

## 2014-08-05 MED ORDER — ESOMEPRAZOLE MAGNESIUM 40 MG PO CPDR: DELAYED_RELEASE_CAPSULE | ORAL | Status: DC

## 2014-08-05 NOTE — Addendum Note (Signed)
Addended by: Tonia Ghent on: 08/05/2014 11:41 AM   Modules accepted: Orders

## 2014-08-05 NOTE — Telephone Encounter (Signed)
Pt stated dr Damita Dunnings called in omeprazole  and this does work as good and Nexium.  He wanted to know if dr Damita Dunnings would call him in some generic Nexium    [facility]

## 2014-08-05 NOTE — Addendum Note (Signed)
Addended by: Tonia Ghent on: 08/05/2014 05:37 AM   Modules accepted: Orders, Medications

## 2014-08-05 NOTE — Telephone Encounter (Signed)
Patient states that the pharmacist told him there is a generic Nexium.  [facility] and pharmacist states it is ES-Omeprazole.  Please advise.

## 2014-08-05 NOTE — Telephone Encounter (Signed)
I sent it again.  It was already listed as esomeprazole on the prev rx.  Even if it is available as a generic, I still don't know if it would be as cheap as other longstanding generic (and lower cost) meds.  I don't know how else to send it.  I'll await pharmacy input.

## 2014-08-05 NOTE — Telephone Encounter (Signed)
To my knowledge, there isn't a generic nexium.  He'll likely need a PA.  rx sent.

## 2014-08-05 NOTE — Telephone Encounter (Signed)
See other phone note.  There is not a generic Nexium.  Omeprazole is the nearest thing to it.  Patient advised.

## 2014-08-07 ENCOUNTER — Telehealth: Payer: Self-pay | Admitting: *Deleted

## 2014-08-07 MED ORDER — PANTOPRAZOLE SODIUM 40 MG PO TBEC: 40.0000 mg | DELAYED_RELEASE_TABLET | Freq: Every day | ORAL | Status: DC

## 2014-08-07 NOTE — Telephone Encounter (Signed)
Nexium not covered. Preferred meds are omeprazole,pantoprazole,rabeprazole,dexilant. Do you want PA or send in substitute?

## 2014-08-07 NOTE — Telephone Encounter (Signed)
Change to pantoprazole.  Sent.  Thanks.

## 2014-08-07 NOTE — Telephone Encounter (Signed)
Patient notified

## 2014-08-20 ENCOUNTER — Telehealth: Payer: Self-pay

## 2014-08-20 NOTE — Telephone Encounter (Signed)
Pt left v/m; pt was changed to pantoprazole 08/07/14; last 2-3 days pts joints and muscles have been aching and pt wants to know if pantoprazole might interact with some other meds pt is taking and pt request cb.

## 2014-08-20 NOTE — Telephone Encounter (Signed)
Less likely. Could be from the lipitor, that's more likely.   I would stop the lipitor for a few days and see if that helps.   Update [place] in a few days.   Thanks.

## 2014-08-20 NOTE — Telephone Encounter (Signed)
Patient notified as instructed by telephone and verbalized understanding. 

## 2014-09-02 ENCOUNTER — Encounter: Payer: Self-pay | Admitting: Family Medicine

## 2014-09-02 ENCOUNTER — Ambulatory Visit (INDEPENDENT_AMBULATORY_CARE_PROVIDER_SITE_OTHER): Payer: 59 | Admitting: Family Medicine

## 2014-09-02 VITALS — BP 132/88 | HR 71 | Temp 98.6°F | Wt 190.2 lb

## 2014-09-02 DIAGNOSIS — J302 Other seasonal allergic rhinitis: Secondary | ICD-10-CM

## 2014-09-02 DIAGNOSIS — J309 Allergic rhinitis, unspecified: Secondary | ICD-10-CM | POA: Insufficient documentation

## 2014-09-02 DIAGNOSIS — E785 Hyperlipidemia, unspecified: Secondary | ICD-10-CM | POA: Diagnosis not present

## 2014-09-02 MED ORDER — FLUTICASONE PROPIONATE 50 MCG/ACT NA SUSP: 2.0000 | Freq: Every day | NASAL | Status: DC

## 2014-09-02 NOTE — Progress Notes (Signed)
Pre visit review using our clinic review tool, if applicable. No additional management support is needed unless otherwise documented below in the visit note.  Tried and failed mult PPIs (prilosec and pantoprazole), got back on OTC nexium with some relief.    Recently with URI sx vs allergy sx.  Started last week.  Was working in the yard.  No FCNAVD.  Frequent rhinorrhea, eyes watering.  Started taking claritin with some relief.  He usually has springtime allergies.  No ST, minimal cough.    His aches are some better off statin.  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  D/w pt.    Meds, vitals, and allergies reviewed.   ROS: See HPI.  Otherwise, noncontributory.  GEN: nad, alert and oriented HEENT: mucous membranes moist, tm w/o erythema, nasal exam w/o erythema, clear discharge noted,  OP wnl, sinuses not ttp NECK: supple w/o LA CV: rrr.   PULM: ctab, no inc wob EXT: no edema Chronic MCP joint changes noted, but no acute changes

## 2014-09-02 NOTE — Patient Instructions (Signed)
Take claritin 10mg  a day.  Use flonase along with it.   Take care.  Glad to see you.

## 2014-09-02 NOTE — Assessment & Plan Note (Signed)
Statin intolerant, he'll stay off statin for now.  D/w pt.  Some of his aches are likely OA, he'll update me as needed.

## 2014-09-02 NOTE — Assessment & Plan Note (Signed)
Likely not infectious, d/w pt. Continue claritin, add on flonase, f/u prn . He agrees.

## 2014-11-14 ENCOUNTER — Encounter: Payer: Self-pay | Admitting: Gastroenterology

## 2014-12-26 ENCOUNTER — Encounter: Payer: Self-pay | Admitting: Primary Care

## 2014-12-26 ENCOUNTER — Ambulatory Visit (INDEPENDENT_AMBULATORY_CARE_PROVIDER_SITE_OTHER): Payer: 59 | Admitting: Primary Care

## 2014-12-26 VITALS — BP 144/84 | HR 84 | Temp 98.7°F | Ht 66.0 in | Wt 191.1 lb

## 2014-12-26 DIAGNOSIS — L237 Allergic contact dermatitis due to plants, except food: Secondary | ICD-10-CM

## 2014-12-26 MED ORDER — PREDNISONE 20 MG PO TABS: ORAL_TABLET | ORAL | Status: DC

## 2014-12-26 MED ORDER — TRIAMCINOLONE ACETONIDE 0.1 % EX CREA: 1.0000 "application " | TOPICAL_CREAM | Freq: Two times a day (BID) | CUTANEOUS | Status: DC

## 2014-12-26 NOTE — Patient Instructions (Signed)
Start Prednisone tablets for poison ivy/oak. Take 2 tablets daily for 7 days, then 1 tablet daily for 7 days.  You may apply the cream twice daily to the affected area for itching.  Follow up if no improvement after you complete the steroids.   It was a pleasure meeting you!  Poison [employer] ivy is a inflammation of the skin (contact dermatitis) caused by touching the allergens on the leaves of the ivy plant following previous exposure to the plant. The rash usually appears 48 hours after exposure. The rash is usually bumps (papules) or blisters (vesicles) in a linear pattern. Depending on your own sensitivity, the rash may simply cause redness and itching, or it may also progress to blisters which may break open. These must be well cared for to prevent secondary bacterial (germ) infection, followed by scarring. Keep any open areas dry, clean, dressed, and covered with an antibacterial ointment if needed. The eyes may also get puffy. The puffiness is worst in the morning and gets better as the day progresses. This dermatitis usually heals without scarring, within 2 to 3 weeks without treatment. HOME CARE INSTRUCTIONS  Thoroughly wash with soap and water as soon as you have been exposed to poison ivy. You have about one half hour to remove the plant resin before it will cause the rash. This washing will destroy the oil or antigen on the skin that is causing, or will cause, the rash. Be sure to wash under your fingernails as any plant resin there will continue to spread the rash. Do not rub skin vigorously when washing affected area. Poison ivy cannot spread if no oil from the plant remains on your body. A rash that has progressed to weeping sores will not spread the rash unless you have not washed thoroughly. It is also important to wash any clothes you have been wearing as these may carry active allergens. The rash will return if you wear the unwashed clothing, even several days later. Avoidance of the  plant in the future is the best measure. Poison ivy plant can be recognized by the number of leaves. Generally, poison ivy has three leaves with flowering branches on a single stem. Diphenhydramine may be purchased over the counter and used as needed for itching. Do not drive with this medication if it makes you drowsy.Ask your caregiver about medication for children. SEEK MEDICAL CARE IF:  Open sores develop.  Redness spreads beyond area of rash.  You notice purulent (pus-like) discharge.  You have increased pain.  Other signs of infection develop (such as fever). Document Released: 04/29/2000 Document Revised: 07/25/2011 Document Reviewed: 10/10/2008 [facility] Patient Information 2015 [place], [place]. This information is not intended to replace advice given to you by your health care provider. Make sure you discuss any questions you have with your health care provider.

## 2014-12-26 NOTE — Progress Notes (Signed)
Subjective:    Patient ID: Adrian Dean, male    DOB: Nov 02, 1955, 59 y.o.   MRN: [id number]  HPI  Adrian Dean is a 59 year old male who presents today with a chief complaint of rash. He first noticed the rash 4 weeks ago since climbing into a tree and cutting it down while in the woods. His rash is located to bilateral upper and lower extremities and chest. He reports itching, denies pain. He's tried applying calogel with temporary relief. Over the past 4 weeks his rash has become more bothersome and has not dissipated.  Review of Systems  Respiratory: Negative for shortness of breath.   Cardiovascular: Negative for chest pain.  Skin: Positive for rash.       Past Medical History  Diagnosis Date  . Other and unspecified hyperlipidemia   . Esophageal reflux 04/20/99    EGD  . Personal history of contact with and (suspected) exposure to asbestos   . Unspecified alveolar and parietoalveolar pneumonopathy   . Unspecified sleep apnea     on CPAP  . Contact dermatitis and other eczema due to detergents   . Other abnormal glucose   . Migraine, unspecified, without mention of intractable migraine without mention of status migrainosus   . Pure hypercholesterolemia   . Family history of malignant neoplasm of prostate   . Unspecified hypothyroidism   . Unspecified essential hypertension   . History of fractured pelvis     fork lift accident- hospital, hip problem  . Hiatal hernia   . Plantar fasciitis     left foot  . Calculus of kidney   . Macular degeneration     R eye 2015    Social History   Social History  . Marital Status: Married    Spouse Name: N/A  . Number of Children: 1  . Years of Education: N/A   Occupational History  . 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 Smoking status: Former Research scientist (life sciences)  . Smokeless tobacco: Never Used     Comment: In 1980s  . Alcohol Use: No  . Drug Use: No  . Sexual  Activity: Not on file   Other Topics Concern  . Not on file   Social History Narrative   Married since 1982.  [employer] fan.  One child at home with muscular dystrophy.  No regular exercise.  Welder.     Past Surgical History  Procedure Laterality Date  . Foot surgery      for plantar fasciitis    Family History  Problem Relation Age of Onset  . Hypertension Mother   . Cirrhosis Mother     non alcohol  . Obesity Mother   . Cancer Father     lung, prostate  . Heart disease Father     MI, angioplasty- stents  . Prostate cancer Father   . Cancer Brother     bladder  . Diabetes Neg Hx   . Depression Neg Hx   . Drug abuse Neg Hx   . Alcohol abuse Neg Hx   . Stroke Neg Hx   . Colon cancer Neg Hx     Allergies  Allergen Reactions  . Codeine Sulfate     REACTION: Headache  . Lipitor [Atorvastatin]     Myalgias    Current Outpatient Prescriptions on File Prior to Visit  Medication Sig Dispense Refill  . fluticasone (FLONASE) 50 MCG/ACT nasal spray  Place 2 sprays into both nostrils daily. 16 g 6  . levothyroxine (SYNTHROID) 50 MCG tablet TAKE 1 TABLET BY MOUTH EVERY DAY 30 tablet 12  . loratadine (CLARITIN) 10 MG tablet Take 10 mg by mouth daily as needed.     . Multiple Vitamin (MULTIVITAMIN) tablet Take 1 tablet by mouth daily.    . OMEGA-3 KRILL OIL PO Take by mouth daily.      . propranolol (INDERAL) 80 MG tablet TAKE ONE TABLET BY MOUTH TWO TIMES A DAY 60 tablet 12  . triamterene-hydrochlorothiazide (MAXZIDE-25) 37.5-25 MG per tablet TAKE 1 TABLET BY MOUTH EVERY DAY 30 tablet 12   No current facility-administered medications on file prior to visit.    BP 144/84 mmHg  Pulse 84  Temp(Src) 98.7 F (37.1 C) (Oral)  Ht 5\' 6"  (1.676 m)  Wt 191 lb 1.9 oz (86.691 kg)  BMI 30.86 kg/m2  SpO2 98%    Objective:   Physical Exam  Constitutional: He appears well-nourished.  Cardiovascular: Normal rate and regular rhythm.   Pulmonary/Chest: Effort normal and breath  sounds normal.  Skin: Rash noted.  Moderate ash present to bilateral upper and lower extremities, mild to chest. All representative of poison ivy.           Assessment & Plan:  Poison Ivy Dermatitis:  Present to bilateral upper and lower extremities and anterior chest. No relief with OTC remedies.  RX for prednisone taper x 2 weeks and triamcinolone cream. Follow up PRN

## 2014-12-26 NOTE — Progress Notes (Signed)
Pre visit review using our clinic review tool, if applicable. No additional management support is needed unless otherwise documented below in the visit note. 

## 2015-01-25 ENCOUNTER — Other Ambulatory Visit: Payer: Self-pay | Admitting: Family Medicine

## 2015-01-25 DIAGNOSIS — E039 Hypothyroidism, unspecified: Secondary | ICD-10-CM

## 2015-01-25 DIAGNOSIS — Z125 Encounter for screening for malignant neoplasm of prostate: Secondary | ICD-10-CM

## 2015-01-25 DIAGNOSIS — I10 Essential (primary) hypertension: Secondary | ICD-10-CM

## 2015-01-29 ENCOUNTER — Other Ambulatory Visit (INDEPENDENT_AMBULATORY_CARE_PROVIDER_SITE_OTHER): Payer: 59

## 2015-01-29 DIAGNOSIS — E039 Hypothyroidism, unspecified: Secondary | ICD-10-CM

## 2015-01-29 DIAGNOSIS — R7989 Other specified abnormal findings of blood chemistry: Secondary | ICD-10-CM | POA: Diagnosis not present

## 2015-01-29 DIAGNOSIS — Z125 Encounter for screening for malignant neoplasm of prostate: Secondary | ICD-10-CM | POA: Diagnosis not present

## 2015-01-29 DIAGNOSIS — I10 Essential (primary) hypertension: Secondary | ICD-10-CM

## 2015-01-29 LAB — COMPREHENSIVE METABOLIC PANEL
ALT: 19 U/L (ref 0–53)
AST: 19 U/L (ref 0–37)
Albumin: 4.3 g/dL (ref 3.5–5.2)
Alkaline Phosphatase: 33 U/L — ABNORMAL LOW (ref 39–117)
BILIRUBIN TOTAL: 0.5 mg/dL (ref 0.2–1.2)
BUN: 18 mg/dL (ref 6–23)
CHLORIDE: 101 meq/L (ref 96–112)
CO2: 30 meq/L (ref 19–32)
CREATININE: 1.02 mg/dL (ref 0.40–1.50)
Calcium: 9.7 mg/dL (ref 8.4–10.5)
GFR: 79.78 mL/min (ref >60.00)
Glucose, Bld: 106 mg/dL — ABNORMAL HIGH (ref 70–99)
Potassium: 4.2 mEq/L (ref 3.5–5.1)
Sodium: 138 mEq/L (ref 135–145)
Total Protein: 6.6 g/dL (ref 6.0–8.3)

## 2015-01-29 LAB — LIPID PANEL
Cholesterol: 196 mg/dL (ref 0–200)
HDL: 32 mg/dL — AB (ref >39.00)
Total CHOL/HDL Ratio: 6
Triglycerides: 408 mg/dL — ABNORMAL HIGH (ref 0.0–149.0)

## 2015-01-29 LAB — TSH: TSH: 2.35 u[IU]/mL (ref 0.35–4.50)

## 2015-01-29 LAB — PSA: PSA: 0.83 ng/mL (ref 0.10–4.00)

## 2015-01-29 LAB — LDL CHOLESTEROL, DIRECT: LDL DIRECT: 68 mg/dL

## 2015-02-05 ENCOUNTER — Encounter: Payer: Self-pay | Admitting: Family Medicine

## 2015-02-05 ENCOUNTER — Ambulatory Visit (INDEPENDENT_AMBULATORY_CARE_PROVIDER_SITE_OTHER): Payer: 59 | Admitting: Family Medicine

## 2015-02-05 VITALS — BP 138/78 | HR 92 | Temp 97.9°F | Ht 66.0 in | Wt 183.1 lb

## 2015-02-05 DIAGNOSIS — Z0000 Encounter for general adult medical examination without abnormal findings: Secondary | ICD-10-CM | POA: Diagnosis not present

## 2015-02-05 DIAGNOSIS — R21 Rash and other nonspecific skin eruption: Secondary | ICD-10-CM | POA: Diagnosis not present

## 2015-02-05 DIAGNOSIS — L237 Allergic contact dermatitis due to plants, except food: Secondary | ICD-10-CM

## 2015-02-05 DIAGNOSIS — I10 Essential (primary) hypertension: Secondary | ICD-10-CM

## 2015-02-05 DIAGNOSIS — Z23 Encounter for immunization: Secondary | ICD-10-CM

## 2015-02-05 DIAGNOSIS — E039 Hypothyroidism, unspecified: Secondary | ICD-10-CM

## 2015-02-05 DIAGNOSIS — E785 Hyperlipidemia, unspecified: Secondary | ICD-10-CM

## 2015-02-05 DIAGNOSIS — Z119 Encounter for screening for infectious and parasitic diseases, unspecified: Secondary | ICD-10-CM

## 2015-02-05 MED ORDER — TRIAMTERENE-HCTZ 37.5-25 MG PO TABS: ORAL_TABLET | ORAL | Status: DC

## 2015-02-05 MED ORDER — PREDNISONE 20 MG PO TABS: ORAL_TABLET | ORAL | Status: DC

## 2015-02-05 MED ORDER — LEVOTHYROXINE SODIUM 50 MCG PO TABS: ORAL_TABLET | ORAL | Status: DC

## 2015-02-05 MED ORDER — PROPRANOLOL HCL 80 MG PO TABS: ORAL_TABLET | ORAL | Status: DC

## 2015-02-05 MED ORDER — FLUTICASONE PROPIONATE 50 MCG/ACT NA SUSP: 2.0000 | Freq: Every day | NASAL | Status: DC

## 2015-02-05 NOTE — Patient Instructions (Signed)
Don't change your regular meds.  Restart prednisone and notify us if the rash doesn't resolve.  Keep working on your weight in the meantime.  Take care.  Glad to see you.

## 2015-02-05 NOTE — Progress Notes (Signed)
CPE- See plan.  Routine anticipatory guidance given to patient.  See health maintenance. Tetanus 2015 Flu shot 2015 PNA and shingles not due yet, d/w pt.  PSA wnl Colonoscopy 2011 Living will d/w pt. Wife designated if patient were incapacitated.  Diet and exercise d/w pt. Diet is good and is walking for exercise.  He's been working on his weight.    Rash.  Resolved prev, then came back.  On the buttock back and legs area.  Done with prev pred and TAC cream.  Itchy.  No trigger known but he has been working in the yard.    TG.  Up on last lab check.  Working on cutting weight.  No ADE on current meds.  Statin intolerant.  Labs d/w pt.   Hypertension:    Using medication without problems or lightheadedness: yes Chest pain with exertion:no Edema:no Short of breath:no  Hypothyroidism.  TSH wnl.  No ADE on med.  No neck mass.    PMH and SH reviewed  Meds, vitals, and allergies reviewed.   ROS: See HPI.  Otherwise negative.    GEN: nad, alert and oriented HEENT: mucous membranes moist NECK: supple w/o LA, no tmg CV: rrr. PULM: ctab, no inc wob ABD: soft, +bs EXT: no edema SKIN: no acute rash except for pinkish maculopapular rash on the buttocks > lower back w/o ulceration.  B and irregular distribution.

## 2015-02-06 DIAGNOSIS — R21 Rash and other nonspecific skin eruption: Secondary | ICD-10-CM | POA: Insufficient documentation

## 2015-02-06 NOTE — Assessment & Plan Note (Signed)
D/w pt. Unclear about the variation with last set of labs.  Would still work on diet and exercise and we can recheck periodically.  No change in meds.  He agrees.

## 2015-02-06 NOTE — Assessment & Plan Note (Signed)
Controlled, continue as is.  No TMG on exam.

## 2015-02-06 NOTE — Assessment & Plan Note (Signed)
Controlled, continue meds, no change.  D/w pt about labs and diet and exercise.  Needs weight reduction.  Okay for outpatient f/u.

## 2015-02-06 NOTE — Assessment & Plan Note (Signed)
Likely re-exposed to prev trigger, likely from yard work.  Doesn't look infectious.  Likely poison oak/ivy or similar.  Restart pred with routine cautions and update me as needed.  He agrees.

## 2015-02-06 NOTE — Assessment & Plan Note (Signed)
Tetanus 2015 Flu shot 2015 PNA and shingles not due yet, d/w pt.  PSA wnl Colonoscopy 2011 Living will d/w pt. Wife designated if patient were incapacitated.  Diet and exercise d/w pt. Diet is good and is walking for exercise.  He's been working on his weight.   Pt opts in for HCV and HIV screening with next set of labs.  D/w pt re: routine screening.

## 2015-02-22 ENCOUNTER — Other Ambulatory Visit: Payer: Self-pay | Admitting: Family Medicine

## 2015-04-02 ENCOUNTER — Encounter: Payer: Self-pay | Admitting: Family Medicine

## 2015-04-02 ENCOUNTER — Ambulatory Visit (INDEPENDENT_AMBULATORY_CARE_PROVIDER_SITE_OTHER): Payer: 59 | Admitting: Family Medicine

## 2015-04-02 VITALS — BP 126/84 | HR 101 | Temp 98.6°F | Wt 191.0 lb

## 2015-04-02 DIAGNOSIS — J0100 Acute maxillary sinusitis, unspecified: Secondary | ICD-10-CM | POA: Diagnosis not present

## 2015-04-02 DIAGNOSIS — G4730 Sleep apnea, unspecified: Secondary | ICD-10-CM | POA: Diagnosis not present

## 2015-04-02 DIAGNOSIS — J320 Chronic maxillary sinusitis: Secondary | ICD-10-CM | POA: Insufficient documentation

## 2015-04-02 MED ORDER — AMOXICILLIN-POT CLAVULANATE 875-125 MG PO TABS
1.0000 | ORAL_TABLET | Freq: Two times a day (BID) | ORAL | Status: DC
Start: 2015-04-02 — End: 2016-02-16

## 2015-04-02 MED ORDER — FLUTICASONE PROPIONATE 50 MCG/ACT NA SUSP: 2.0000 | Freq: Every day | NASAL | Status: DC

## 2015-04-02 NOTE — Patient Instructions (Signed)
Take augmentin and flonase for the sinus infection.  Take the order and report to the DME supplier.  Take care.  Glad to see you.

## 2015-04-02 NOTE — Assessment & Plan Note (Signed)
Continue use, rx written for replacement parts and copy of prev sleep study given to patient.  He'll check with DME.

## 2015-04-02 NOTE — Progress Notes (Signed)
Pre visit review using our clinic review tool, if applicable. No additional management support is needed unless otherwise documented below in the visit note.  Sx started about 1 week ago.  Started with rhinorrhea, ST, eye irritation.  Sick contacts at home.  No fevers.  Some cough.  Some sputum, clear.  No ear pain.  Some facial pain, maxillary. Taking claritin in meantime.  Overall, not improving in the last few days.    Needs replacement CPAP parts.  Has been using with good effect.  Used nightly.  Sleep and energy level improved with CPAP use.  Mask broke and needs replacement.    Meds, vitals, and allergies reviewed.   ROS: See HPI.  Otherwise, noncontributory.  GEN: nad, alert and oriented HEENT: mucous membranes moist, tm w/o erythema, nasal exam w/o erythema, clear discharge noted,  OP with cobblestoning, max sinuses ttp NECK: supple w/o LA CV: rrr.   PULM: ctab, no inc wob

## 2015-04-02 NOTE — Assessment & Plan Note (Signed)
Nontoxic, flonase, augmentin, supportive care.  F/u prn.  He agrees.

## 2016-02-05 ENCOUNTER — Other Ambulatory Visit: Payer: Self-pay | Admitting: Family Medicine

## 2016-02-09 ENCOUNTER — Other Ambulatory Visit: Payer: Self-pay | Admitting: Family Medicine

## 2016-02-09 ENCOUNTER — Other Ambulatory Visit (INDEPENDENT_AMBULATORY_CARE_PROVIDER_SITE_OTHER): Payer: 59

## 2016-02-09 DIAGNOSIS — Z119 Encounter for screening for infectious and parasitic diseases, unspecified: Secondary | ICD-10-CM

## 2016-02-09 DIAGNOSIS — R7989 Other specified abnormal findings of blood chemistry: Secondary | ICD-10-CM | POA: Diagnosis not present

## 2016-02-09 DIAGNOSIS — Z125 Encounter for screening for malignant neoplasm of prostate: Secondary | ICD-10-CM

## 2016-02-09 DIAGNOSIS — I10 Essential (primary) hypertension: Secondary | ICD-10-CM | POA: Diagnosis not present

## 2016-02-09 DIAGNOSIS — N492 Inflammatory disorders of scrotum: Secondary | ICD-10-CM

## 2016-02-09 DIAGNOSIS — E039 Hypothyroidism, unspecified: Secondary | ICD-10-CM

## 2016-02-09 LAB — COMPREHENSIVE METABOLIC PANEL
ALK PHOS: 35 U/L — AB (ref 39–117)
ALT: 25 U/L (ref 0–53)
AST: 22 U/L (ref 0–37)
Albumin: 4 g/dL (ref 3.5–5.2)
BILIRUBIN TOTAL: 0.5 mg/dL (ref 0.2–1.2)
BUN: 22 mg/dL (ref 6–23)
CO2: 31 mEq/L (ref 19–32)
Calcium: 9.1 mg/dL (ref 8.4–10.5)
Chloride: 101 mEq/L (ref 96–112)
Creatinine, Ser: 1.03 mg/dL (ref 0.40–1.50)
GFR: 78.61 mL/min (ref >60.00)
GLUCOSE: 104 mg/dL — AB (ref 70–99)
POTASSIUM: 3.9 meq/L (ref 3.5–5.1)
SODIUM: 137 meq/L (ref 135–145)
TOTAL PROTEIN: 6.5 g/dL (ref 6.0–8.3)

## 2016-02-09 LAB — LIPID PANEL
CHOL/HDL RATIO: 6
Cholesterol: 180 mg/dL (ref 0–200)
HDL: 29.6 mg/dL — ABNORMAL LOW (ref >39.00)
Triglycerides: 530 mg/dL — ABNORMAL HIGH (ref 0.0–149.0)

## 2016-02-09 LAB — TSH: TSH: 2.69 u[IU]/mL (ref 0.35–4.50)

## 2016-02-09 LAB — PSA: PSA: 0.85 ng/mL (ref 0.10–4.00)

## 2016-02-09 LAB — LDL CHOLESTEROL, DIRECT: LDL DIRECT: 55 mg/dL

## 2016-02-10 LAB — HEPATITIS C ANTIBODY: HCV Ab: NEGATIVE

## 2016-02-10 LAB — HIV ANTIBODY (ROUTINE TESTING W REFLEX): HIV 1&2 Ab, 4th Generation: NONREACTIVE

## 2016-02-16 ENCOUNTER — Encounter: Payer: Self-pay | Admitting: Family Medicine

## 2016-02-16 ENCOUNTER — Ambulatory Visit (INDEPENDENT_AMBULATORY_CARE_PROVIDER_SITE_OTHER): Payer: 59 | Admitting: Family Medicine

## 2016-02-16 VITALS — BP 138/88 | HR 76 | Temp 98.0°F | Wt 195.5 lb

## 2016-02-16 DIAGNOSIS — E039 Hypothyroidism, unspecified: Secondary | ICD-10-CM

## 2016-02-16 DIAGNOSIS — Z0000 Encounter for general adult medical examination without abnormal findings: Secondary | ICD-10-CM | POA: Diagnosis not present

## 2016-02-16 DIAGNOSIS — Z23 Encounter for immunization: Secondary | ICD-10-CM | POA: Diagnosis not present

## 2016-02-16 DIAGNOSIS — I10 Essential (primary) hypertension: Secondary | ICD-10-CM

## 2016-02-16 DIAGNOSIS — R21 Rash and other nonspecific skin eruption: Secondary | ICD-10-CM

## 2016-02-16 MED ORDER — LEVOTHYROXINE SODIUM 50 MCG PO TABS: 50.0000 ug | ORAL_TABLET | Freq: Every day | ORAL | 12 refills | Status: DC

## 2016-02-16 MED ORDER — TRIAMCINOLONE ACETONIDE 0.5 % EX CREA: 1.0000 "application " | TOPICAL_CREAM | Freq: Three times a day (TID) | CUTANEOUS | 1 refills | Status: DC | PRN

## 2016-02-16 MED ORDER — PROPRANOLOL HCL 80 MG PO TABS: 80.0000 mg | ORAL_TABLET | Freq: Two times a day (BID) | ORAL | 12 refills | Status: DC

## 2016-02-16 MED ORDER — TRIAMTERENE-HCTZ 37.5-25 MG PO TABS: 1.0000 | ORAL_TABLET | Freq: Every day | ORAL | 12 refills | Status: DC

## 2016-02-16 NOTE — Patient Instructions (Signed)
Take care.  Glad to see you. Update me as needed.  

## 2016-02-16 NOTE — Progress Notes (Signed)
Pre visit review using our clinic review tool, if applicable. No additional management support is needed unless otherwise documented below in the visit note. 

## 2016-02-16 NOTE — Progress Notes (Signed)
CPE- See plan.  Routine anticipatory guidance given to patient.  See health maintenance. Tetanus 2015 Flu shot updated today.   PNA and shingles not due yet, d/w pt.  PSA wnl Colonoscopy 2011 Living will d/w pt. Wife designated if patient were incapacitated.  Diet and exercise d/w pt. Diet is good and is walking for exercise.  He's been working on his weight.  he got up to 199 lbs but has been working on his weight.  HCV and HIV screening prev done.  D/w pt.   Rash on abd, used topical TAC with relief.  Itchy.  Not painful.  Variable location.  This is recurrent over the years.    Hypothyroidism.  No ADE on med.  No neck mass.  No dysphagia.    Hypertension:    Using medication without problems or lightheadedness: yes Chest pain with exertion:no Edema:no Short of breath:no  PMH and SH reviewed  Meds, vitals, and allergies reviewed.   ROS: Per HPI.  Unless specifically indicated otherwise in HPI, the patient denies:  General: fever. Eyes: acute vision changes ENT: sore throat Cardiovascular: chest pain Respiratory: SOB GI: vomiting GU: dysuria Musculoskeletal: acute back pain Derm: acute rash Neuro: acute motor dysfunction Psych: worsening mood Endocrine: polydipsia Heme: bleeding Allergy: hayfever  GEN: nad, alert and oriented HEENT: mucous membranes moist NECK: supple w/o LA CV: rrr. PULM: ctab, no inc wob ABD: soft, +bs EXT: no edema SKIN: faint maculopapular rash on the abd, not ttp, not dermatomal.

## 2016-02-17 NOTE — Assessment & Plan Note (Signed)
Controlled on recheck, d/w pt about diet and exercise.  Continue as is with meds.

## 2016-02-17 NOTE — Assessment & Plan Note (Signed)
Continue as is.  TSH wnl.

## 2016-02-17 NOTE — Assessment & Plan Note (Signed)
Tetanus 2015 Flu shot updated today.   PNA and shingles not due yet, d/w pt.  PSA wnl Colonoscopy 2011 Living will d/w pt. Wife designated if patient were incapacitated.  Diet and exercise d/w pt. Diet is good and is walking for exercise.  He's been working on his weight.  he got up to 199 lbs but has been working on his weight.  HCV and HIV screening prev done.  D/w pt.

## 2016-02-17 NOTE — Assessment & Plan Note (Signed)
Unclear source, okay to use TAC prn and update me as needed.  No systemic sx, no stridor or lip swelling, no airway sx.

## 2016-03-14 ENCOUNTER — Encounter: Payer: Self-pay | Admitting: Podiatry

## 2016-03-14 ENCOUNTER — Ambulatory Visit (INDEPENDENT_AMBULATORY_CARE_PROVIDER_SITE_OTHER): Payer: 59

## 2016-03-14 ENCOUNTER — Ambulatory Visit (INDEPENDENT_AMBULATORY_CARE_PROVIDER_SITE_OTHER): Payer: 59 | Admitting: Podiatry

## 2016-03-14 DIAGNOSIS — M722 Plantar fascial fibromatosis: Secondary | ICD-10-CM

## 2016-03-14 DIAGNOSIS — R52 Pain, unspecified: Secondary | ICD-10-CM

## 2016-03-14 MED ORDER — METHYLPREDNISOLONE 4 MG PO TBPK: ORAL_TABLET | ORAL | 0 refills | Status: DC

## 2016-03-14 MED ORDER — MELOXICAM 15 MG PO TABS: 15.0000 mg | ORAL_TABLET | Freq: Every day | ORAL | 3 refills | Status: DC

## 2016-03-14 NOTE — Progress Notes (Signed)
He presents today with a chief complaint of painful right heel. He states that he was doing very good for the past 3 years and now over the past 6 months his right heel has started to bother him once again.  Objective: Vital signs are stable alert and oriented 3. Pulses are palpable. Neurologic services intact deep tendon reflexes are intact muscle strength is symmetrical normal bilateral. Orthopedic evaluation demonstrates pain on palpation medial calcaneal tubercle of the right heel. Radiographs taken today do demonstrate soft tissue increase in density of the plantar fascia continual insertion site of the right heel.  Assessment: Plantar fasciitis right heel.  Plan: I injected the right heel today with Kenalog and local anesthetic. I also wrote him a prescription for a Medrol Dosepak to be followed by meloxicam. Placed him in a plantar fascia brace and a night splint. Discussed appropriate shoe gear stretching exercises ice therapy issue modifications. Dispensed prescriptions today for previously mentioned medications I also dispensed directions for stretching. Follow up with him in 1 month necessary. May need to consider orthotics.

## 2016-03-14 NOTE — Patient Instructions (Signed)

## 2016-04-18 ENCOUNTER — Ambulatory Visit: Payer: 59 | Admitting: Podiatry

## 2016-04-22 ENCOUNTER — Ambulatory Visit (INDEPENDENT_AMBULATORY_CARE_PROVIDER_SITE_OTHER): Payer: 59 | Admitting: Family Medicine

## 2016-04-22 ENCOUNTER — Encounter: Payer: Self-pay | Admitting: Family Medicine

## 2016-04-22 VITALS — BP 136/80 | HR 76 | Temp 98.2°F | Wt 199.2 lb

## 2016-04-22 DIAGNOSIS — J069 Acute upper respiratory infection, unspecified: Secondary | ICD-10-CM | POA: Diagnosis not present

## 2016-04-22 MED ORDER — BENZONATATE 100 MG PO CAPS: 100.0000 mg | ORAL_CAPSULE | Freq: Three times a day (TID) | ORAL | 0 refills | Status: DC | PRN

## 2016-04-22 MED ORDER — AMOXICILLIN 875 MG PO TABS: 875.0000 mg | ORAL_TABLET | Freq: Two times a day (BID) | ORAL | 0 refills | Status: DC

## 2016-04-22 NOTE — Progress Notes (Signed)
Subjective:    Patient ID: Adrian Dean, male    DOB: [birth year], 60 y.o.   MRN: [id number]  HPI This is a 60 yo male who presents today with runny nose, watery eyes, post nasal drainage x 3 days. Cough with thick green sputum. No SOB, no wheeze. Cough worse with lying down. Has taken Claritin and Mucinex without relief. Has resumed Flonase. Has problems twice a year typically- spring and fall. Has son with CF and is concerned about making him sick.  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   Past Medical History:  Diagnosis Date  . Calculus of kidney   . Contact dermatitis and other eczema due to detergents   . Esophageal reflux 04/20/99   EGD  . Family history of malignant neoplasm of prostate   . Hiatal hernia   . History of fractured pelvis    fork lift accident- hospital, hip problem  . Macular degeneration    R eye 2015  . Migraine, unspecified, without mention of intractable migraine without mention of status migrainosus   . Other abnormal glucose   . Other and unspecified hyperlipidemia   . Plantar fasciitis    left foot  . Pure hypercholesterolemia   . Unspecified alveolar and parietoalveolar pneumonopathy   . Unspecified essential hypertension   . Unspecified hypothyroidism   . Unspecified sleep apnea    on CPAP   Past Surgical History:  Procedure Laterality Date  . FOOT SURGERY     for plantar fasciitis   Family History  Problem Relation Age of Onset  . Hypertension Mother   . Cirrhosis Mother     non alcohol  . Obesity Mother   . Cancer Father     lung, prostate  . Heart disease Father     MI, angioplasty- stents  . Prostate cancer Father   . Cancer Brother     bladder  . Diabetes Neg Hx   . Depression Neg Hx   . Drug abuse Neg Hx   . Alcohol abuse Neg Hx   . Stroke Neg Hx   . Colon cancer Neg Hx    Social History  Substance Use Topics  . Smoking status: Former Research scientist (life sciences)  . Smokeless tobacco: Never Used     Comment: In 1980s    . Alcohol use No      Review of Systems Per HPI    Objective:   Physical Exam  Constitutional: He is oriented to person, place, and time. He appears well-developed and well-nourished. No distress.  HENT:  Head: Normocephalic and atraumatic.  Right Ear: Tympanic membrane, external ear and ear canal normal.  Left Ear: Tympanic membrane, external ear and ear canal normal.  Nose: Mucosal edema and rhinorrhea present. Right sinus exhibits no maxillary sinus tenderness and no frontal sinus tenderness. Left sinus exhibits no maxillary sinus tenderness and no frontal sinus tenderness.  Mouth/Throat: Uvula is midline. Posterior oropharyngeal erythema present. No oropharyngeal exudate or posterior oropharyngeal edema.  Post nasal drainage.  Eyes: Conjunctivae are normal.  Neck: Normal range of motion. Neck supple.  Cardiovascular: Normal rate, regular rhythm and normal heart sounds.   Pulmonary/Chest: Effort normal and breath sounds normal. No respiratory distress. He has no wheezes. He has no rales.  Lymphadenopathy:    He has no cervical adenopathy.  Neurological: He is alert and oriented to person, place, and time.  Skin: Skin is warm and dry. He is not diaphoretic.  Psychiatric: He has  a normal mood and affect. His behavior is normal. Judgment and thought content normal.  Vitals reviewed.     BP 136/80   Pulse 76   Temp 98.2 F (36.8 C) (Oral)   Wt 199 lb 4 oz (90.4 kg)   SpO2 98%   BMI 32.16 kg/m  Wt Readings from Last 3 Encounters:  04/22/16 199 lb 4 oz (90.4 kg)  02/16/16 195 lb 8 oz (88.7 kg)  04/02/15 191 lb (86.6 kg)       Assessment & Plan:  1. Upper respiratory infection with cough and congestion - likely viral, provided written information regarding symptomatic treatment, encouraged fluids and rest - provided wait and see antibiotic if not better in 5 days, can start - benzonatate (TESSALON) 100 MG capsule; Take 1-2 capsules (100-200 mg total) by mouth 3  (three) times daily as needed for cough.  Dispense: 40 capsule; Refill: 0 - amoxicillin (AMOXIL) 875 MG tablet; Take 1 tablet (875 mg total) by mouth 2 (two) times daily.  Dispense: 14 tablet; Refill: 0   Clarene Reamer, FNP-BC  [facility]  04/22/2016 11:08 AM

## 2016-04-22 NOTE — Patient Instructions (Signed)
For nasal congestion, continue Flonase and Claritin, can add Afrin nasal spray 2 sprays in each nostril twice a day for maximum of 3 days If not better in 5 days, start antibiotic   Upper Respiratory Infection, Adult Most upper respiratory infections (URIs) are a viral infection of the air passages leading to the lungs. A URI affects the nose, throat, and upper air passages. The most common type of URI is nasopharyngitis and is typically referred to as "the common cold." URIs run their course and usually go away on their own. Most of the time, a URI does not require medical attention, but sometimes a bacterial infection in the upper airways can follow a viral infection. This is called a secondary infection. Sinus and middle ear infections are common types of secondary upper respiratory infections. Bacterial pneumonia can also complicate a URI. A URI can worsen asthma and chronic obstructive pulmonary disease (COPD). Sometimes, these complications can require emergency medical care and may be life threatening. What are the causes? Almost all URIs are caused by viruses. A virus is a type of germ and can spread from one person to another. What increases the risk? You may be at risk for a URI if:  You smoke.  You have chronic heart or lung disease.  You have a weakened defense (immune) system.  You are very young or very old.  You have nasal allergies or asthma.  You work in crowded or poorly ventilated areas.  You work in health care facilities or schools. What are the signs or symptoms? Symptoms typically develop 2-3 days after you come in contact with a cold virus. Most viral URIs last 7-10 days. However, viral URIs from the influenza virus (flu virus) can last 14-18 days and are typically more severe. Symptoms may include:  Runny or stuffy (congested) nose.  Sneezing.  Cough.  Sore throat.  Headache.  Fatigue.  Fever.  Loss of appetite.  Pain in your forehead, behind your  eyes, and over your cheekbones (sinus pain).  Muscle aches. How is this diagnosed? Your health care provider may diagnose a URI by:  Physical exam.  Tests to check that your symptoms are not due to another condition such as:  Strep throat.  Sinusitis.  Pneumonia.  Asthma. How is this treated? A URI goes away on its own with time. It cannot be cured with medicines, but medicines may be prescribed or recommended to relieve symptoms. Medicines may help:  Reduce your fever.  Reduce your cough.  Relieve nasal congestion. Follow these instructions at home:  Take medicines only as directed by your health care provider.  Gargle warm saltwater or take cough drops to comfort your throat as directed by your health care provider.  Use a warm mist humidifier or inhale steam from a shower to increase air moisture. This may make it easier to breathe.  Drink enough fluid to keep your urine clear or pale yellow.  Eat soups and other clear broths and maintain good nutrition.  Rest as needed.  Return to work when your temperature has returned to normal or as your health care provider advises. You may need to stay home longer to avoid infecting others. You can also use a face mask and careful hand washing to prevent spread of the virus.  Increase the usage of your inhaler if you have asthma.  Do not use any tobacco products, including cigarettes, chewing tobacco, or electronic cigarettes. If you need help quitting, ask your health care provider. How is  this prevented? The best way to protect yourself from getting a cold is to practice good hygiene.  Avoid oral or hand contact with people with cold symptoms.  Wash your hands often if contact occurs. There is no clear evidence that vitamin C, vitamin E, echinacea, or exercise reduces the chance of developing a cold. However, it is always recommended to get plenty of rest, exercise, and practice good nutrition. Contact a health care  provider if:  You are getting worse rather than better.  Your symptoms are not controlled by medicine.  You have chills.  You have worsening shortness of breath.  You have brown or red mucus.  You have yellow or brown nasal discharge.  You have pain in your face, especially when you bend forward.  You have a fever.  You have swollen neck glands.  You have pain while swallowing.  You have white areas in the back of your throat. Get help right away if:  You have severe or persistent:  Headache.  Ear pain.  Sinus pain.  Chest pain.  You have chronic lung disease and any of the following:  Wheezing.  Prolonged cough.  Coughing up blood.  A change in your usual mucus.  You have a stiff neck.  You have changes in your:  Vision.  Hearing.  Thinking.  Mood. This information is not intended to replace advice given to you by your health care provider. Make sure you discuss any questions you have with your health care provider. Document Released: 10/26/2000 Document Revised: 01/03/2016 Document Reviewed: 08/07/2013 Elsevier Interactive Patient Education  2017 [employer].

## 2016-04-22 NOTE — Progress Notes (Signed)
Pre visit review using our clinic review tool, if applicable. No additional management support is needed unless otherwise documented below in the visit note. 

## 2016-07-02 ENCOUNTER — Other Ambulatory Visit: Payer: Self-pay | Admitting: Podiatry

## 2016-07-04 NOTE — Telephone Encounter (Signed)
Pt needs an appt prior to future refills. 

## 2017-02-12 ENCOUNTER — Other Ambulatory Visit: Payer: Self-pay | Admitting: Family Medicine

## 2017-02-12 DIAGNOSIS — I10 Essential (primary) hypertension: Secondary | ICD-10-CM

## 2017-02-12 DIAGNOSIS — Z125 Encounter for screening for malignant neoplasm of prostate: Secondary | ICD-10-CM

## 2017-02-13 ENCOUNTER — Other Ambulatory Visit (INDEPENDENT_AMBULATORY_CARE_PROVIDER_SITE_OTHER): Payer: 59

## 2017-02-13 DIAGNOSIS — Z125 Encounter for screening for malignant neoplasm of prostate: Secondary | ICD-10-CM | POA: Diagnosis not present

## 2017-02-13 DIAGNOSIS — I10 Essential (primary) hypertension: Secondary | ICD-10-CM | POA: Diagnosis not present

## 2017-02-13 LAB — LIPID PANEL
CHOL/HDL RATIO: 6
CHOLESTEROL: 194 mg/dL (ref 0–200)
HDL: 31.3 mg/dL — ABNORMAL LOW (ref >39.00)
Triglycerides: 469 mg/dL — ABNORMAL HIGH (ref 0.0–149.0)

## 2017-02-13 LAB — TSH: TSH: 3.46 u[IU]/mL (ref 0.35–4.50)

## 2017-02-13 LAB — COMPREHENSIVE METABOLIC PANEL
ALBUMIN: 4.4 g/dL (ref 3.5–5.2)
ALK PHOS: 30 U/L — AB (ref 39–117)
ALT: 31 U/L (ref 0–53)
AST: 23 U/L (ref 0–37)
BUN: 18 mg/dL (ref 6–23)
CO2: 29 mEq/L (ref 19–32)
Calcium: 9.8 mg/dL (ref 8.4–10.5)
Chloride: 101 mEq/L (ref 96–112)
Creatinine, Ser: 1.12 mg/dL (ref 0.40–1.50)
GFR: 71.12 mL/min (ref >60.00)
Glucose, Bld: 114 mg/dL — ABNORMAL HIGH (ref 70–99)
POTASSIUM: 4.6 meq/L (ref 3.5–5.1)
SODIUM: 139 meq/L (ref 135–145)
TOTAL PROTEIN: 6.6 g/dL (ref 6.0–8.3)
Total Bilirubin: 0.5 mg/dL (ref 0.2–1.2)

## 2017-02-13 LAB — PSA: PSA: 0.98 ng/mL (ref 0.10–4.00)

## 2017-02-13 LAB — LDL CHOLESTEROL, DIRECT: Direct LDL: 75 mg/dL

## 2017-02-16 ENCOUNTER — Ambulatory Visit (INDEPENDENT_AMBULATORY_CARE_PROVIDER_SITE_OTHER): Payer: 59 | Admitting: Family Medicine

## 2017-02-16 ENCOUNTER — Encounter: Payer: Self-pay | Admitting: Family Medicine

## 2017-02-16 VITALS — BP 124/76 | HR 77 | Temp 98.6°F | Ht 66.0 in | Wt 200.5 lb

## 2017-02-16 DIAGNOSIS — I10 Essential (primary) hypertension: Secondary | ICD-10-CM | POA: Diagnosis not present

## 2017-02-16 DIAGNOSIS — Z23 Encounter for immunization: Secondary | ICD-10-CM | POA: Diagnosis not present

## 2017-02-16 DIAGNOSIS — R7309 Other abnormal glucose: Secondary | ICD-10-CM

## 2017-02-16 DIAGNOSIS — E785 Hyperlipidemia, unspecified: Secondary | ICD-10-CM

## 2017-02-16 DIAGNOSIS — Z7189 Other specified counseling: Secondary | ICD-10-CM

## 2017-02-16 DIAGNOSIS — Z0000 Encounter for general adult medical examination without abnormal findings: Secondary | ICD-10-CM | POA: Diagnosis not present

## 2017-02-16 DIAGNOSIS — E039 Hypothyroidism, unspecified: Secondary | ICD-10-CM | POA: Diagnosis not present

## 2017-02-16 MED ORDER — LEVOTHYROXINE SODIUM 50 MCG PO TABS: 50.0000 ug | ORAL_TABLET | Freq: Every day | ORAL | 12 refills | Status: DC

## 2017-02-16 MED ORDER — FENOFIBRATE 48 MG PO TABS: 48.0000 mg | ORAL_TABLET | Freq: Every day | ORAL | 12 refills | Status: DC

## 2017-02-16 MED ORDER — TRIAMTERENE-HCTZ 37.5-25 MG PO TABS: 1.0000 | ORAL_TABLET | Freq: Every day | ORAL | 12 refills | Status: DC

## 2017-02-16 MED ORDER — PROPRANOLOL HCL 80 MG PO TABS: 80.0000 mg | ORAL_TABLET | Freq: Two times a day (BID) | ORAL | 12 refills | Status: DC

## 2017-02-16 MED ORDER — FLUTICASONE PROPIONATE 50 MCG/ACT NA SUSP: 2.0000 | Freq: Every day | NASAL | 12 refills | Status: DC

## 2017-02-16 NOTE — Patient Instructions (Addendum)
Check with your insurance to see if they will cover the shingrix shot. Keep working on [employer].  Recheck labs in about 6-8 weeks when fasting assuming you can tolerate the fenofibrate.  Take care.  Glad to see you.  Update me as needed.

## 2017-02-16 NOTE — Progress Notes (Signed)
CPE- See plan.  Routine anticipatory guidance given to patient.  See health maintenance.  The possibility exists that previously documented standard health maintenance information may have been brought forward from a previous encounter into this note.  If needed, that same information has been updated to reflect the current situation based on today's encounter.    Tetanus 2015 Flu shot updated today.   PNA not due.   Shingles d/w pt.  See AVS.  PSA wnl Colonoscopy 2011 Living will d/w pt. Wife designated if patient were incapacitated.  Diet and exercise d/w pt, "not too good."  He is working long hours and that affects his situation.  "My diet could be better."  D/w pt about portion control and cutting out sweets.   HCV and HIV screening prev done.  D/w pt.   Hypothyroidism.  TSH wnl.  No neck mass, no dysphagia.  Compliant.    Hypertension:    Using medication without problems or lightheadedness: yes Chest pain with exertion:no Edema:no Short of breath:no Statin intolerant.  Labs d/w pt.   He is getting a shop built at his house and he is happy about that.   PMH and SH reviewed  Meds, vitals, and allergies reviewed.   ROS: Per HPI.  Unless specifically indicated otherwise in HPI, the patient denies:  General: fever. Eyes: acute vision changes ENT: sore throat Cardiovascular: chest pain Respiratory: SOB GI: vomiting GU: dysuria Musculoskeletal: acute back pain Derm: acute rash Neuro: acute motor dysfunction Psych: worsening mood Endocrine: polydipsia Heme: bleeding Allergy: hayfever  GEN: nad, alert and oriented HEENT: mucous membranes moist NECK: supple w/o LA CV: rrr. PULM: ctab, no inc wob ABD: soft, +bs EXT: no edema SKIN: no acute rash

## 2017-02-17 ENCOUNTER — Telehealth: Payer: Self-pay | Admitting: *Deleted

## 2017-02-17 NOTE — Assessment & Plan Note (Signed)
TSH normal. No neck mass. Continue as is. He agrees.

## 2017-02-17 NOTE — Telephone Encounter (Signed)
Fax received requesting alternative for Fenofibrate 48 mg tablet.  Alternative requested:  54 mg or 160 mg covered.  Please advise.

## 2017-02-17 NOTE — Assessment & Plan Note (Signed)
Living will d/w pt.  Wife designated if patient were incapacitated.   ?

## 2017-02-17 NOTE — Assessment & Plan Note (Signed)
Not diabetic but not normal. Discussed with patient about diet and exercise.

## 2017-02-17 NOTE — Assessment & Plan Note (Signed)
Needs work on diet. Add on fenofibrate and see if he can tolerate that. Recheck labs later on assuming he can tolerate medication. He agrees.

## 2017-02-17 NOTE — Assessment & Plan Note (Signed)
Tetanus 2015 Flu shot updated today.   PNA not due.   Shingles d/w pt.  See AVS.  PSA wnl Colonoscopy 2011 Living will d/w pt. Wife designated if patient were incapacitated.  Diet and exercise d/w pt, "not too good."  He is working long hours and that affects his situation.  "My diet could be better."  D/w pt about portion control and cutting out sweets.   HCV and HIV screening prev done.  D/w pt.

## 2017-02-17 NOTE — Assessment & Plan Note (Signed)
Reasonable control. Needs more work on diet. Discussed with patient about labs in sugar. No change in meds.

## 2017-02-19 MED ORDER — FENOFIBRATE 54 MG PO TABS: 54.0000 mg | ORAL_TABLET | Freq: Every day | ORAL | 12 refills | Status: DC

## 2017-02-19 NOTE — Telephone Encounter (Signed)
Change to 54mg .  Sent.  Thanks.  Same plan applies for patient.

## 2017-02-19 NOTE — Addendum Note (Signed)
Addended by: Tonia Ghent on: 02/19/2017 04:02 PM   Modules accepted: Orders

## 2017-03-12 ENCOUNTER — Other Ambulatory Visit: Payer: Self-pay | Admitting: Family Medicine

## 2017-03-13 ENCOUNTER — Other Ambulatory Visit: Payer: Self-pay | Admitting: Family Medicine

## 2017-03-17 ENCOUNTER — Ambulatory Visit: Payer: Self-pay

## 2017-03-17 ENCOUNTER — Other Ambulatory Visit: Payer: Self-pay | Admitting: Family Medicine

## 2017-03-17 NOTE — Telephone Encounter (Signed)
Spoke to patient and was advised that he is not having any chest pain, SOB, etc since stopping the fenofibrate. Patient stated at times he has a little tightness in his chest. Advised patient to not start back on the medication per Dr. Damita Dunnings. Patient was advised to go to the ER if symptoms return and he verbalized understanding. Patient stated that he has an upcoming appointment scheduled with Dr. Damita Dunnings to discuss this and does not feel that he needs to come in sooner.

## 2017-03-17 NOTE — Telephone Encounter (Addendum)
  Pt states Chest tightness was 1 month ago. Pt states he stopped Fenofibrate 2 days ago and chest tightness has improved significantly. Pt denies chest pain, pressure and no other sx. Acute appt on Tuesday 03/21/17. Pt states he only called to tell Dr Damita Dunnings that he stopped the Fenofibrate.  Answer Assessment - Initial Assessment Questions 1. LOCATION: "Where does it hurt?"       Chest tightness left side not pain 2. RADIATION: "Does the pain go anywhere else?" (e.g., into neck, jaw, arms, back)     Back top of shoulders 3. ONSET: "When did the chest pain begin?" (Minutes, hours or days)      03/06/17 4. PATTERN "Does the pain come and go, or has it been constant since it started?"  "Does it get worse with exertion?"     constant 5. DURATION: "How long does it last" (e.g., seconds, minutes, hours)     Tightness is constant 6. SEVERITY: "How bad is the pain?"  (e.g., Scale 1-10; mild, moderate, or severe)    - MILD (1-3): doesn't interfere with normal activities     - MODERATE (4-7): interferes with normal activities or awakens from sleep    - SEVERE (8-10): excruciating pain, unable to do any normal activities       3/10 7. CARDIAC RISK FACTORS: "Do you have any history of heart problems or risk factors for heart disease?" (e.g., prior heart attack, angina; high blood pressure, diabetes, being overweight, high cholesterol, smoking, or strong family history of heart disease)     HTN, overweight, Father had MI 79. PULMONARY RISK FACTORS: "Do you have any history of lung disease?"  (e.g., blood clots in lung, asthma, emphysema, birth control pills)     no 9. CAUSE: "What do you think is causing the chest pain?"     Fenofibrate 10. OTHER SYMPTOMS: "Do you have any other symptoms?" (e.g., dizziness, nausea, vomiting, sweating, fever, difficulty breathing, cough)       no 11. PREGNANCY: "Is there any chance you are pregnant?" "When was your last menstrual period?"       n/a  Protocols used:  CHEST PAIN-A-AH

## 2017-03-19 NOTE — Telephone Encounter (Signed)
Noted, agreed.  Thanks. 

## 2017-03-21 ENCOUNTER — Ambulatory Visit (INDEPENDENT_AMBULATORY_CARE_PROVIDER_SITE_OTHER): Payer: 59 | Admitting: Family Medicine

## 2017-03-21 ENCOUNTER — Encounter: Payer: Self-pay | Admitting: Family Medicine

## 2017-03-21 VITALS — BP 150/88 | HR 85 | Temp 98.6°F | Wt 198.2 lb

## 2017-03-21 DIAGNOSIS — R0789 Other chest pain: Secondary | ICD-10-CM

## 2017-03-21 MED ORDER — FENOFIBRATE 54 MG PO TABS: 54.0000 mg | ORAL_TABLET | Freq: Every day | ORAL | Status: DC

## 2017-03-21 NOTE — Progress Notes (Signed)
He had some L chest/L upper abdominal pressure. Started about 1 week after starting the fenofibrate.  He stopped fenofibrate in the meantime about 03/06/17 w/o relief of sx.  Sx are present some of the time.  Not worse with exertion.  Not SOB.  No sweats.  Still able to work like normal, with a physical job.  No BLE edema.  Random onset and relief, no clear patter, except it usually gets worse with leaning over.  No pain with deep breath.  No rash, no bruising, no trauma.  No more burping than normal.  No blood in stool, no black stools, no vomiting.  No nausea.  Not getting better or worse overall.  No ibuprofen use.    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   Meds, vitals, and allergies reviewed.   ROS: Per HPI unless specifically indicated in ROS section   GEN: nad, alert and oriented HEENT: mucous membranes moist NECK: supple w/o LA CV: rrr. PULM: ctab, no inc wob ABD: soft, +bs EXT: no edema SKIN: no acute rash L chest wall not ttp but he has some discomfort with leaning forward, along the lower edge of the ribs in the left midclavicular line.  This discomfort is improved with external compression of the area when he leans forward.  It is clearly reproducible and modifiable.

## 2017-03-21 NOTE — Patient Instructions (Signed)
Likely a muscle strain, okay to use ibuprofen with food/heat/ice.   Update me if not getting better.   Okay to restart fenofibrate.  Take care.  Glad to see you.

## 2017-03-22 DIAGNOSIS — R0789 Other chest pain: Secondary | ICD-10-CM | POA: Insufficient documentation

## 2017-03-22 NOTE — Assessment & Plan Note (Signed)
Likely a chest wall/musculoskeletal strain related to significant lifting at work.  See above.  I am not concerned for intrathoracic issues.  Discussed with patient about options.  Likely not related to fenofibrate. Okay to use ibuprofen with food/heat/ice.   Update me if not getting better.   Okay to restart fenofibrate.  He agrees.  EKG wnl.

## 2017-03-30 ENCOUNTER — Other Ambulatory Visit: Payer: 59

## 2017-04-28 ENCOUNTER — Other Ambulatory Visit (INDEPENDENT_AMBULATORY_CARE_PROVIDER_SITE_OTHER): Payer: 59

## 2017-04-28 DIAGNOSIS — E785 Hyperlipidemia, unspecified: Secondary | ICD-10-CM | POA: Diagnosis not present

## 2017-04-28 LAB — HEPATIC FUNCTION PANEL
ALBUMIN: 4.4 g/dL (ref 3.5–5.2)
ALT: 38 U/L (ref 0–53)
AST: 26 U/L (ref 0–37)
Alkaline Phosphatase: 32 U/L — ABNORMAL LOW (ref 39–117)
Bilirubin, Direct: 0.1 mg/dL (ref 0.0–0.3)
Total Bilirubin: 0.6 mg/dL (ref 0.2–1.2)
Total Protein: 7.1 g/dL (ref 6.0–8.3)

## 2017-04-28 LAB — LIPID PANEL
Cholesterol: 195 mg/dL (ref 0–200)
HDL: 33.6 mg/dL — ABNORMAL LOW (ref >39.00)
Total CHOL/HDL Ratio: 6
Triglycerides: 486 mg/dL — ABNORMAL HIGH (ref 0.0–149.0)

## 2017-04-28 LAB — LDL CHOLESTEROL, DIRECT: Direct LDL: 80 mg/dL

## 2017-05-02 ENCOUNTER — Other Ambulatory Visit: Payer: Self-pay | Admitting: Family Medicine

## 2017-05-02 DIAGNOSIS — E785 Hyperlipidemia, unspecified: Secondary | ICD-10-CM

## 2017-05-02 MED ORDER — FENOFIBRATE 54 MG PO TABS: 108.0000 mg | ORAL_TABLET | Freq: Every day | ORAL | Status: DC

## 2017-05-15 ENCOUNTER — Telehealth: Payer: Self-pay | Admitting: Family Medicine

## 2017-05-15 NOTE — Telephone Encounter (Signed)
Copied from [place] [id number]. Topic: Quick Communication - See Telephone Encounter >> May 15, 2017 12:46 PM Hewitt Shorts wrote: CRM for notification. See Telephone encounter for: pt needs better instructions for his fenofinate 54 mg   05/15/17.

## 2017-05-15 NOTE — Telephone Encounter (Signed)
Attempted to call patient to discuss his concerns / No answer / Left message for patient to give office a call back

## 2017-05-17 ENCOUNTER — Telehealth: Payer: Self-pay | Admitting: Family Medicine

## 2017-05-17 MED ORDER — FENOFIBRATE 54 MG PO TABS: 108.0000 mg | ORAL_TABLET | Freq: Every day | ORAL | 3 refills | Status: DC

## 2017-05-17 NOTE — Telephone Encounter (Addendum)
Sent.  Corrected.  Thanks.  I apologize for the delay.  Update me as needed on this rx.

## 2017-05-17 NOTE — Telephone Encounter (Signed)
Copied from [place]. Topic: Quick Communication - See Telephone Encounter >> May 17, 2017 10:42 AM Cleaster Corin, NT wrote: CRM for notification. See Telephone encounter for:   05/17/17. Pt. Calling and stating that [facility] receive rx. For med. Fenofibrate pt. Would like for someone to give him a call about this matter. Pt can be reached at [phone]  [id number] - [place], [address] [place] [address] Phone: [phone] Fax: [phone]

## 2017-05-17 NOTE — Telephone Encounter (Signed)
Patient notified as instructed by telephone and verbalized understanding. 

## 2017-05-17 NOTE — Telephone Encounter (Signed)
Encounter signed in error   °

## 2017-05-17 NOTE — Telephone Encounter (Signed)
See other phone note. Confirm script and directions . Looks like script was a no print.

## 2017-05-17 NOTE — Addendum Note (Signed)
Addended by: Tonia Ghent on: 05/17/2017 11:21 AM   Modules accepted: Orders

## 2017-07-04 ENCOUNTER — Other Ambulatory Visit (INDEPENDENT_AMBULATORY_CARE_PROVIDER_SITE_OTHER): Payer: Managed Care, Other (non HMO)

## 2017-07-04 DIAGNOSIS — E785 Hyperlipidemia, unspecified: Secondary | ICD-10-CM | POA: Diagnosis not present

## 2017-07-04 LAB — LIPID PANEL
Cholesterol: 181 mg/dL (ref 0–200)
HDL: 30.4 mg/dL — AB (ref >39.00)
NONHDL: 150.6
Total CHOL/HDL Ratio: 6
Triglycerides: 289 mg/dL — ABNORMAL HIGH (ref 0.0–149.0)
VLDL: 57.8 mg/dL — ABNORMAL HIGH (ref 0.0–40.0)

## 2017-07-04 LAB — LDL CHOLESTEROL, DIRECT: LDL DIRECT: 93 mg/dL

## 2017-08-22 ENCOUNTER — Ambulatory Visit (INDEPENDENT_AMBULATORY_CARE_PROVIDER_SITE_OTHER)
Admission: RE | Admit: 2017-08-22 | Discharge: 2017-08-22 | Disposition: A | Discharge status: Home / Self Care | Payer: Managed Care, Other (non HMO) | Source: Ambulatory Visit | Attending: Family Medicine | Admitting: Family Medicine

## 2017-08-22 ENCOUNTER — Ambulatory Visit (INDEPENDENT_AMBULATORY_CARE_PROVIDER_SITE_OTHER): Payer: Managed Care, Other (non HMO) | Admitting: Family Medicine

## 2017-08-22 ENCOUNTER — Encounter: Payer: Self-pay | Admitting: Family Medicine

## 2017-08-22 VITALS — BP 138/90 | HR 67 | Temp 98.3°F | Wt 202.8 lb

## 2017-08-22 DIAGNOSIS — R0789 Other chest pain: Secondary | ICD-10-CM

## 2017-08-22 DIAGNOSIS — M545 Low back pain: Secondary | ICD-10-CM

## 2017-08-22 DIAGNOSIS — M79606 Pain in leg, unspecified: Secondary | ICD-10-CM | POA: Diagnosis not present

## 2017-08-22 MED ORDER — PREDNISONE 10 MG PO TABS: ORAL_TABLET | ORAL | 0 refills | Status: DC

## 2017-08-22 NOTE — Patient Instructions (Signed)
Xraying your back likely isn't needed now.  Check CXR today.   Stop ibuprofen.  Change to prednisone with tylenol as needed.  Update me if not better . Take care.  Glad to see you.  Go to the lab on the way out.  We'll contact you with your xray report.
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
## 2017-08-23 ENCOUNTER — Telehealth: Payer: Self-pay | Admitting: Family Medicine

## 2017-08-23 NOTE — Telephone Encounter (Signed)
Copied from [place] [id number]. Topic: Quick Communication - See Telephone Encounter >> Aug 23, 2017 12:20 PM Aurelio Brash B wrote: CRM for notification. See Telephone encounter for: 08/23/17.  PT is requesting a call from Dr Damita Dunnings  to explain the chest xray results   [phone]

## 2017-08-23 NOTE — Telephone Encounter (Signed)
Patient notified as instructed by telephone and verbalized understanding. Patient stated that he is concerned about the knot and is wondering if he should get a MRI done?  Patient stated that he does not want something to be going on and it be missed.

## 2017-08-23 NOTE — Telephone Encounter (Signed)
Call pt.  He has a few degenerative change in the thoracic spine- likely noncontributory and predate the pain he has been having.  See result note.  Thanks.

## 2017-08-23 NOTE — Assessment & Plan Note (Signed)
See notes on chest x-ray.  He does a lot of heavy lifting at work.  I think this is likely a chronic upper abdominal wall strain, near the junction with the lower anterior ribs near the left midclavicular line.  I do not suspect an ominous pathology.  Discussed with patient.

## 2017-08-23 NOTE — Assessment & Plan Note (Signed)
I think this is a benign strain.  I do not suspect fracture or ominous pathology.  No red flag symptoms by history or exam.  Okay for outpatient follow-up.  Given his work situation I want to avoid sedating medications.  Rationale discussed with patient.  He agrees. Stop ibuprofen.  Change to prednisone with tylenol as needed.  Update me if not better.  He agrees.

## 2017-08-24 NOTE — Telephone Encounter (Signed)
I don't think there is anything ominous that is going on o/w and you wouldn't get an MRI anyway to eval this.  It's likely in the chest wall and if you were going to do anything it would be a CT.  It likely isn't worth the radiation exposure for the CT at [facility] point.  I would have him keep an eye on it and if worse then let me know.  Thanks.

## 2017-08-24 NOTE — Telephone Encounter (Signed)
Patient advised.  Patient again reiterates that he is concerned and doesn't want it to be something and have not caught it early on.

## 2017-08-25 NOTE — Telephone Encounter (Signed)
Patient advised. Appointment scheduled.  

## 2017-08-25 NOTE — Telephone Encounter (Signed)
I think we should get him to see sports med about this to get a second opinion prior to CT. Please see about getting him set up with Dr. Lorelei Pont.  I'd like his input first.  Thanks.

## 2017-08-28 ENCOUNTER — Other Ambulatory Visit: Payer: Self-pay

## 2017-08-28 ENCOUNTER — Ambulatory Visit (INDEPENDENT_AMBULATORY_CARE_PROVIDER_SITE_OTHER): Payer: Managed Care, Other (non HMO) | Admitting: Family Medicine

## 2017-08-28 ENCOUNTER — Encounter: Payer: Self-pay | Admitting: Family Medicine

## 2017-08-28 VITALS — BP 132/78 | HR 76 | Temp 98.4°F | Ht 66.0 in | Wt 199.8 lb

## 2017-08-28 DIAGNOSIS — R0789 Other chest pain: Secondary | ICD-10-CM | POA: Diagnosis not present

## 2017-08-28 DIAGNOSIS — Z87891 Personal history of nicotine dependence: Secondary | ICD-10-CM
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
## 2017-08-28 NOTE — Patient Instructions (Signed)

## 2017-08-29 LAB — BASIC METABOLIC PANEL
BUN: 22 mg/dL (ref 6–23)
CALCIUM: 9.9 mg/dL (ref 8.4–10.5)
CO2: 28 meq/L (ref 19–32)
Chloride: 99 mEq/L (ref 96–112)
Creatinine, Ser: 1.16 mg/dL (ref 0.40–1.50)
GFR: 68.17 mL/min (ref >60.00)
GLUCOSE: 99 mg/dL (ref 70–99)
Potassium: 4.2 mEq/L (ref 3.5–5.1)
SODIUM: 136 meq/L (ref 135–145)

## 2017-09-04 ENCOUNTER — Ambulatory Visit (INDEPENDENT_AMBULATORY_CARE_PROVIDER_SITE_OTHER)
Admission: RE | Admit: 2017-09-04 | Discharge: 2017-09-04 | Disposition: A | Discharge status: Home / Self Care | Payer: Managed Care, Other (non HMO) | Source: Ambulatory Visit | Attending: Family Medicine | Admitting: Family Medicine

## 2017-09-04 DIAGNOSIS — R0789 Other chest pain: Secondary | ICD-10-CM

## 2017-09-04 DIAGNOSIS — Z87891 Personal history of nicotine dependence: Secondary | ICD-10-CM

## 2017-09-04 MED ORDER — IOPAMIDOL (ISOVUE-300) INJECTION 61%
80.0000 mL | Freq: Once | INTRAVENOUS | Status: AC | PRN
  Administered 2017-09-04: 80 mL via INTRAVENOUS

## 2017-09-08 ENCOUNTER — Telehealth: Payer: Self-pay | Admitting: Family Medicine

## 2017-09-08 NOTE — Telephone Encounter (Signed)
Copied from [place] [id number]. Topic: General - Other >> Sep 08, 2017  1:51 PM Yvette Rack wrote: Reason for CRM: patient calling for CT results

## 2017-09-10 NOTE — Telephone Encounter (Signed)
I have already called him and LMOM.

## 2017-09-11 ENCOUNTER — Other Ambulatory Visit: Payer: Self-pay | Admitting: Family Medicine

## 2017-09-11 DIAGNOSIS — Z8249 Family history of ischemic heart disease and other diseases of the circulatory system: Secondary | ICD-10-CM

## 2017-09-11 DIAGNOSIS — I2510 Atherosclerotic heart disease of native coronary artery without angina pectoris: Secondary | ICD-10-CM

## 2017-09-11 NOTE — Progress Notes (Signed)
done

## 2017-09-15 ENCOUNTER — Ambulatory Visit (INDEPENDENT_AMBULATORY_CARE_PROVIDER_SITE_OTHER): Payer: Managed Care, Other (non HMO) | Admitting: Internal Medicine

## 2017-09-15 ENCOUNTER — Encounter: Payer: Self-pay | Admitting: Internal Medicine

## 2017-09-15 ENCOUNTER — Telehealth: Payer: Self-pay | Admitting: *Deleted

## 2017-09-15 VITALS — BP 146/94 | HR 78 | Ht 68.0 in | Wt 199.1 lb

## 2017-09-15 DIAGNOSIS — R1012 Left upper quadrant pain: Secondary | ICD-10-CM | POA: Diagnosis not present

## 2017-09-15 DIAGNOSIS — R400 Somnolence: Secondary | ICD-10-CM

## 2017-09-15 DIAGNOSIS — G4733 Obstructive sleep apnea (adult) (pediatric): Secondary | ICD-10-CM

## 2017-09-15 DIAGNOSIS — E7849 Other hyperlipidemia: Secondary | ICD-10-CM | POA: Diagnosis not present

## 2017-09-15 MED ORDER — ICOSAPENT ETHYL 1 G PO CAPS: 2.0000 g | ORAL_CAPSULE | Freq: Two times a day (BID) | ORAL | 11 refills | Status: DC

## 2017-09-15 MED ORDER — FENOFIBRATE 160 MG PO TABS: 160.0000 mg | ORAL_TABLET | Freq: Every day | ORAL | 3 refills | Status: DC

## 2017-09-15 NOTE — Patient Instructions (Signed)
Your physician has recommended you make the following change in your medication:  1.) increase fenofibrate to 160 mg daily 2.) start Vascepa 1 g--take 2 tablets (2 g) by mouth two times a day  Your physician recommends that you return for lab work in: 2-3 months (lipids)  Your physician has recommended that you have a sleep study. This test records several body functions during sleep, including: brain activity, eye movement, oxygen and carbon dioxide blood levels, heart rate and rhythm, breathing rate and rhythm, the flow of air through your mouth and nose, snoring, body muscle movements, and chest and belly movement.

## 2017-09-15 NOTE — Telephone Encounter (Signed)
-----   Message from Rodman Key, RN sent at 09/15/2017  5:40 PM EDT ----- Regarding: sleep appt Pt needs sleep study - MLST - to r/o narcolepsy.  Was seen in office today by Dr. Harrington Challenger. Thank you.

## 2017-09-15 NOTE — Progress Notes (Signed)
Cardiology Office Note   Date:  09/15/2017   ID:  Adrian Dean, DOB [birth year], MRN [id number]  PCP:  Tonia Ghent, MD  Cardiologist:   Dorris Carnes, MD   Pt referred by Dr Lorelei Pont for chest pressure    History of Present Illness: Adrian Dean is a 62 y.o. male with a history of chest pressure   L side  He was seen by S Copland on 08/28/17   Pt works as Building control surveyor   Hx smoking   Quit remotely  On talking to him he says he developed LUQ pain in November   Not associated with activity  Occurs while sitting    At work he is active   No pain    Wife says husband is using CPAP But he still can fall asleep very easily   " at a rock concert.  It was embarrassing"  Current Meds  Medication Sig  . esomeprazole (NEXIUM) 20 MG capsule Take 20 mg by mouth daily at 12 noon.  . fenofibrate 54 MG tablet Take 2 tablets (108 mg total) by mouth daily.  . fluticasone (FLONASE) 50 MCG/ACT nasal spray Place 2 sprays into both nostrils daily.  Marland Kitchen loratadine (CLARITIN) 10 MG tablet Take 10 mg by mouth daily as needed for allergies.   . Multiple Vitamin (MULTIVITAMIN) tablet Take 1 tablet by mouth daily.  . Multiple Vitamins-Minerals (PRESERVISION AREDS 2 PO) Take 1 capsule by mouth daily.   . OMEGA-3 KRILL OIL PO Take 1 capsule by mouth daily.   . propranolol (INDERAL) 80 MG tablet Take 1 tablet (80 mg total) by mouth 2 (two) times daily.  Marland Kitchen SYNTHROID 50 MCG tablet TAKE 1 TABLET (50 MCG TOTAL) BY MOUTH DAILY.  Marland Kitchen triamcinolone cream (KENALOG) 0.5 % Apply 1 application topically 3 (three) times daily as needed.  . triamterene-hydrochlorothiazide (MAXZIDE-25) 37.5-25 MG tablet Take 1 tablet by mouth daily.     Allergies:   Codeine sulfate and Lipitor [atorvastatin]   Past Medical History:  Diagnosis Date  . Calculus of kidney   . Contact dermatitis and other eczema due to detergents   . Esophageal reflux 04/20/99   EGD  . Family history of malignant neoplasm of prostate   . Hiatal hernia     . History of fractured pelvis    fork lift accident- hospital, hip problem  . Macular degeneration    R eye 2015  . Migraine, unspecified, without mention of intractable migraine without mention of status migrainosus   . Other abnormal glucose   . Other and unspecified hyperlipidemia   . Plantar fasciitis    left foot  . Pure hypercholesterolemia   . Unspecified alveolar and parietoalveolar pneumonopathy   . Unspecified essential hypertension   . Unspecified hypothyroidism   . Unspecified sleep apnea    on CPAP    Past Surgical History:  Procedure Laterality Date  . FOOT SURGERY     for plantar fasciitis     Social History:  The patient  reports that he has quit smoking. He has never used smokeless tobacco. He reports that he does not drink alcohol or use drugs.   Family History:  The patient's family history includes Cancer in his brother and father; Cirrhosis in his mother; Heart disease in his father; Hypertension in his mother; Obesity in his mother; Prostate cancer in his father.    ROS:  Please see the history of present illness. All other systems are reviewed and  Negative to  the above problem except as noted.    PHYSICAL EXAM: VS:  BP (!) 146/94   Pulse 78   Ht 5\' 8"  (1.727 m)   Wt 199 lb 1.9 oz (90.3 kg)   BMI 30.28 kg/m   GEN: Overweight 62 yo , in no acute distress  HEENT: normal  Neck: no JVD, carotid bruits, or masses Cardiac: RRR; no murmurs, rubs, or gallops,no edema  Chest   Nontender  Respiratory:  clear to auscultation bilaterally, normal work of breathing GI: Sl distended   nontender  Positive  BS  No hepatomegaly  MS: no deformity Moving all extremities   Skin: warm and dry, no rash Neuro:  Strength and sensation are intact Psych: euthymic mood, full affect   EKG:  EKG is not ordered today.  On 04/10/17:  SR     Lipid Panel    Component Value Date/Time   CHOL 181 07/04/2017 0757   TRIG 289.0 (H) 07/04/2017 0757   HDL 30.40 (L)  07/04/2017 0757   CHOLHDL 6 07/04/2017 0757   VLDL 57.8 (H) 07/04/2017 0757   LDLCALC 131 (H) 01/08/2007 0845   LDLDIRECT 93.0 07/04/2017 0757      Wt Readings from Last 3 Encounters:  09/15/17 199 lb 1.9 oz (90.3 kg)  08/28/17 199 lb 12 oz (90.6 kg)  08/22/17 202 lb 12 oz (92 kg)      ASSESSMENT AND PLAN:  1 LUQ pain   I am not convinced this represents angina   It is upper abdomen   He is overweight and he may have some increased pressure from distension on this side  REcomm cutting back on food intake and walking more   2  Dyslipidemia   REcent lipids show elevation of trig   I have reviewed with pharmacy  Would increase fenofibrate to 160 mg   Add Vascepa 2 g bid to regimen  Will need lipids in 2 to 3 months   3   Sleep   Pt with history of sleep apnea  Using CPAP   Wife says that he can fall asleep at a rock concert.  I have reviewed wit hT Turner   ? Narcolepsy component. Recomm repeat sleep study (last one was years ago )  Add MSLT on day 2 to evaluate for narcolepsy     Current medicines are reviewed at length with the patient today.  The patient does not have concerns regarding medicines.  Signed, Dorris Carnes, MD  09/15/2017 2:59 PM    [address], [place], [place]  [address] Phone: [phone]; Fax: [phone]

## 2017-09-19 ENCOUNTER — Other Ambulatory Visit: Payer: Self-pay | Admitting: Internal Medicine

## 2017-09-19 ENCOUNTER — Telehealth: Payer: Self-pay | Admitting: *Deleted

## 2017-09-19 DIAGNOSIS — Z9989 Dependence on other enabling machines and devices: Principal | ICD-10-CM

## 2017-09-19 DIAGNOSIS — G4733 Obstructive sleep apnea (adult) (pediatric): Secondary | ICD-10-CM

## 2017-09-19 NOTE — Telephone Encounter (Signed)
Submitted PA request to [facility] via web portal for in lab split night sleep study and MLST approval.

## 2017-09-20 NOTE — Telephone Encounter (Signed)
Received fax from [employer] stating more information needed to process medical necessity for splitnight with MSLT.Contacted patient to obtain dme for compliance download and patient informed me he ordered his CPAP and supplies from [employer]. Found sleep study form 2006 in epic which I faxed back to [employer].Patient may not be eligible for MSLT due to outdated sleep study. Will precert for regular split night. Patient is aware and agreeable.

## 2017-09-21 NOTE — Telephone Encounter (Signed)
Patient called today stating he will look to see if he has a chip in his machine to bring to me to get a download as his insurance has requested to approve his sleep study. Patient will contact me on Friday 5/10 with a yes he has a chip or no he does not have a chip.  I spoke to Griffin in authorizations at [phone] Management Dept. And informed her I sent over the 2006 sleep study and I would send the compliance report if he has a chip to download. Hinton Dyer said fax the download over if I got one if not they would make a decision off of what they had.  Pt is aware and agreeable to treatment.

## 2017-09-22 NOTE — Telephone Encounter (Signed)
Patient has been advised to call [facility] to determine what machine he has as to whether or not a download can be obtained. Patient was agreeable to suggestions.

## 2017-09-28 ENCOUNTER — Telehealth: Payer: Self-pay | Admitting: *Deleted

## 2017-09-28 NOTE — Telephone Encounter (Addendum)
Called patient to inform him that his sleep study was denied twice by his insurance [employer]. Patient states he no longer has [place] but has Garment/textile technologist.  Staff message sent to Mariann Laster to resubmit with patients Adrian Dean. Patient no longer has [employer].

## 2017-09-28 NOTE — Telephone Encounter (Signed)
  Lauralee Evener, CMA  Rodman Key, RN  Cc: Freada Bergeron, CMA        Both studies were denied by [facility]. citing no co morbidities. If provider wants to do a peer to peer they will need to call [phone]. INTAKE # [id number].  PROCEDURE #'s if they ask are 95811 ( split night study) 95805 (MLST)     ----- Message -----  From: Rodman Key, RN  Sent: 09/15/2017  5:40 PM  To: Freada Bergeron, CMA, Rodman Key, RN, *  Subject: sleep appt                    Pt needs sleep study - MLST - to r/o narcolepsy. Was seen in office today by Dr. Harrington Challenger.  Thank you.

## 2017-09-28 NOTE — Telephone Encounter (Signed)
  Lauralee Evener, CMA  Rodman Key, RN  Cc: Freada Bergeron, CMA        Both studies were denied by [facility]. citing no co morbidities. If provider wants to do a peer to peer they will need to call [phone]. INTAKE # [id number].  PROCEDURE #'s if they ask are 95811 ( split night study) 95805 (MLST)     ----- Message -----  From: Rodman Key, RN  Sent: 09/15/2017  5:40 PM  To: Freada Bergeron, CMA, Rodman Key, RN, *  Subject: sleep appt                    Pt needs sleep study - MLST - to r/o narcolepsy. Was seen in office today by Dr. Harrington Challenger.  Thank you.

## 2017-09-28 NOTE — Telephone Encounter (Signed)
Staff message sent to Gae Bon and ordering provider's RN insurance denied split night study as well as the MLST request. No co morbidities.

## 2017-09-28 NOTE — Telephone Encounter (Signed)
Patient states [facility] was not able to obtain a download on his cpap.

## 2017-09-29 ENCOUNTER — Telehealth: Payer: Self-pay | Admitting: *Deleted

## 2017-09-29 NOTE — Telephone Encounter (Signed)
Messages forwarded to Dr. Harrington Challenger re: denial of sleep study per previous telephone encounter.

## 2017-10-12 ENCOUNTER — Encounter: Payer: Self-pay | Admitting: *Deleted

## 2017-10-12 NOTE — Telephone Encounter (Addendum)
Dr Harrington Challenger completed a PEER TO PEER with [facility] on 10/10/17 and the study was still denied. After speaking to the [place] I was informed the patient now needs an appeals letter sent explaining why the patient needs the sleep study and any documentation providing medical necessity.  Spoke to [place] W.who suggested doing an appeal for the MSLT and then order a Home Sleep Test, a cpap titration will follow that and then the patient can get a new CPAP machine which is what he is trying to get.

## 2017-11-25 ENCOUNTER — Telehealth: Payer: Managed Care, Other (non HMO) | Admitting: Family

## 2017-11-25 DIAGNOSIS — M545 Low back pain: Secondary | ICD-10-CM

## 2017-11-25 MED ORDER — BACLOFEN 10 MG PO TABS: 10.0000 mg | ORAL_TABLET | Freq: Three times a day (TID) | ORAL | 0 refills | Status: DC | PRN

## 2017-11-25 MED ORDER — ETODOLAC 300 MG PO CAPS: 300.0000 mg | ORAL_CAPSULE | Freq: Two times a day (BID) | ORAL | 0 refills | Status: DC

## 2017-11-25 NOTE — Progress Notes (Signed)

## 2017-11-27 ENCOUNTER — Telehealth: Payer: Self-pay

## 2017-11-27 NOTE — Telephone Encounter (Signed)
PLEASE NOTE: All timestamps contained within this report are represented as [place] Standard Time. CONFIDENTIALTY NOTICE: This fax transmission is intended only for the addressee. It contains information that is legally privileged, confidential or otherwise protected from use or disclosure. If you are not the intended recipient, you are strictly prohibited from reviewing, disclosing, copying using or disseminating any of this information or taking any action in reliance on or regarding this information. If you have received this fax in error, please notify us immediately by telephone so that we can arrange for its return to [place]. Phone: [phone], Toll-Free: [phone], Fax: [phone] Page: 1 of 2 Call Id: [id number] [place] Patient Name: Adrian Dean Gender: Male DOB: 1955-12-24 Age: 62 Y 66 M 17 D Return Phone Number: [phone] (Primary) Address: City/State/Zip: [place] [address] Client [facility] - Client Client Site [facility] Physician Renford Dills - MD Contact Type Call Who Is Calling Patient / Member / Family / Caregiver Call Type Triage / Clinical Relationship To Patient Self Return Phone Number [phone] (Primary) Chief Complaint Back Pain - General Reason for Call Symptomatic / Request for Health Information Initial Comment pinched nerve and bun spurns. hardly can move this morning Translation No Nurse Assessment Nurse: Jimmye Norman, RN, [place] Date/Time (Eastern Time): 11/25/2017 9:33:13 AM Confirm and document reason for call. If symptomatic, describe symptoms. ---caller states he has a pinched nerve and bun spurns. hardly can move this morning Does the patient have any new or worsening symptoms? ---Yes Will a triage be completed? ---Yes Related visit to physician within the last 2 weeks? ---Yes Does  the PT have any chronic conditions? (i.e. diabetes, asthma, etc.) ---Yes List chronic conditions. ---hypertension, hypothyroidism, GERD, sleep apnea Is this a behavioral health or substance abuse call? ---No Guidelines Guideline Title Affirmed Question Affirmed Notes Nurse Date/Time (Eastern Time) Back Pain [1] SEVERE back pain (e.g., excruciating, unable to do any normal activities) AND [2] not improved 2 hours after pain medicine Jimmye Norman, RN, Whitney 11/25/2017 9:36:41 AM Disp. Time Eilene Ghazi Time) Disposition Final User 11/25/2017 9:43:03 AM See HCP within 4 Hours (or PCP triage) Yes Jimmye Norman, RN, Whitney PLEASE NOTE: All timestamps contained within this report are represented as [place] Standard Time. CONFIDENTIALTY NOTICE: This fax transmission is intended only for the addressee. It contains information that is legally privileged, confidential or otherwise protected from use or disclosure. If you are not the intended recipient, you are strictly prohibited from reviewing, disclosing, copying using or disseminating any of this information or taking any action in reliance on or regarding this information. If you have received this fax in error, please notify us immediately by telephone so that we can arrange for its return to [place]. Phone: [phone], Toll-Free: [phone], Fax: [phone] Page: 2 of 2 Call Id: [id number] [place] Disagree/Comply Comply Caller Understands Yes PreDisposition InappropriateToAsk Care Advice Given Per Guideline SEE HCP WITHIN 4 HOURS (OR PCP TRIAGE): * IF OFFICE WILL BE CLOSED AND NO PCP (PRIMARY CARE PROVIDER) SECOND-LEVEL TRIAGE: You need to be seen within the next 3 or 4 hours. A nearby Urgent Care Center [facility]) is often a good source of care. Another choice is to go to the ED. Go sooner if you become worse. CALL BACK IF: * You become worse. After Care Instructions Given Call Event Type User Date / Time Description Education document email Artist Pais 11/25/2017 9:42:11 AM Gershon Mussel  [facility] Instructions Referrals GO TO FACILITY UNDECIDED

## 2017-11-27 NOTE — Telephone Encounter (Signed)
Pt had e visit on 11/25/17.

## 2017-11-27 NOTE — Telephone Encounter (Signed)
PLEASE NOTE: All timestamps contained within this report are represented as [place] Standard Time. CONFIDENTIALTY NOTICE: This fax transmission is intended only for the addressee. It contains information that is legally privileged, confidential or otherwise protected from use or disclosure. If you are not the intended recipient, you are strictly prohibited from reviewing, disclosing, copying using or disseminating any of this information or taking any action in reliance on or regarding this information. If you have received this fax in error, please notify us immediately by telephone so that we can arrange for its return to [place]. Phone: [phone], Toll-Free: [phone], Fax: [phone] Page: 1 of 1 Call Id: [id number] [place] Patient Name: Adrian Dean Gender: Male DOB: 01/07/1956 Age: 61 Y 58 M 17 D Return Phone Number: [phone] (Primary) Address: City/State/Zip: [place] [address] Client [facility] Night - Client Client Site [place] Physician Renford Dills - MD Contact Type Call Who Is Calling Patient / Member / Family / Caregiver Call Type Triage / Clinical Relationship To Patient Self Return Phone Number [phone] (Primary) Chief Complaint Back Pain - General Reason for Call Symptomatic / Request for [place] states has a pinched never and bone spurs in lower back, asking if he can get muscle relaxers called in Translation No Nurse Assessment Nurse: Jimmye Norman, RN, [place] Date/Time (Eastern Time): 11/25/2017 10:38:35 AM Confirm and document reason for call. If symptomatic, describe symptoms. ---Caller states has a pinched never and bone spurs in lower back, asking if he can get muscle relaxers called in Does the patient have any new or worsening symptoms? ---No Please document  clinical information provided and list any resource used. ---RN explained that medications are not called in after hours. Caller states he has put in for an appointment through the [facility], just hasn't heard back yet from them. States that it has only been about 20 minutes so far, but instructed it could take up to an hour. RN instructed caller to continue to wait for them to call, they should call. If not he should go in to urgent care or [facility]. Caller verbalized understanding Guidelines Guideline Title Affirmed Question Affirmed Notes Nurse Date/Time (Eastern Time) Disp. Time Eilene Ghazi Time) Disposition Final User 11/25/2017 10:40:31 AM Clinical Call Yes Jimmye Norman, RN, Loree Fee

## 2017-11-28 NOTE — Telephone Encounter (Signed)
Patient has seen chiropractor along with the anti-inflammatories and muscle relaxers and is feeling much better.

## 2017-11-28 NOTE — Telephone Encounter (Signed)
Noted, thanks.  Have patient f/u if not better soon. Thanks.

## 2017-11-28 NOTE — Telephone Encounter (Signed)
Great.  Thanks

## 2017-12-13 ENCOUNTER — Other Ambulatory Visit: Payer: Managed Care, Other (non HMO) | Admitting: *Deleted

## 2017-12-13 DIAGNOSIS — E7849 Other hyperlipidemia: Secondary | ICD-10-CM

## 2017-12-13 LAB — LIPID PANEL
CHOL/HDL RATIO: 6.3 ratio — AB (ref 0.0–5.0)
CHOLESTEROL TOTAL: 188 mg/dL (ref 100–199)
HDL: 30 mg/dL — ABNORMAL LOW (ref >39)
LDL Calculated: 112 mg/dL — ABNORMAL HIGH (ref 0–99)
TRIGLYCERIDES: 231 mg/dL — AB (ref 0–149)
VLDL Cholesterol Cal: 46 mg/dL — ABNORMAL HIGH (ref 5–40)

## 2017-12-25 ENCOUNTER — Other Ambulatory Visit: Payer: Self-pay | Admitting: *Deleted

## 2017-12-25 DIAGNOSIS — E7849 Other hyperlipidemia: Secondary | ICD-10-CM

## 2017-12-25 MED ORDER — ROSUVASTATIN CALCIUM 10 MG PO TABS: 10.0000 mg | ORAL_TABLET | Freq: Every day | ORAL | 3 refills | Status: DC

## 2017-12-26 ENCOUNTER — Telehealth: Payer: Self-pay | Admitting: Internal Medicine

## 2017-12-26 NOTE — Telephone Encounter (Addendum)
I returned the patient's call and I have messaged Dr Radford Pax to see how best to assist this patient as we can not send a new prescription without having the settings for the machine. His machine is very old and it does not have a chip so it is difficult trying to get a download to see what setting he is on.

## 2017-12-26 NOTE — Telephone Encounter (Signed)
New message:      Pt is calling and states he has spoken with [facility] and they need [place] to send a new prescription for a new cpap machine to [facility] at fax number [phone]. Pt also states that [facility] stated we could appeal there decision not to pay for the pt's sleep study.

## 2017-12-27 NOTE — Telephone Encounter (Signed)
Pt is aware and agreeable to a his appointment scheduled on 03/08/18 as a new patient to see Dr Radford Pax.  Patient agrees to start over with split night study and agrees that we will need to appeal insurance for sleep study to get it paid for by his insurance.

## 2018-02-18 ENCOUNTER — Other Ambulatory Visit: Payer: Self-pay | Admitting: Family Medicine

## 2018-02-19 ENCOUNTER — Other Ambulatory Visit: Payer: Self-pay | Admitting: Family Medicine

## 2018-02-19 ENCOUNTER — Other Ambulatory Visit (INDEPENDENT_AMBULATORY_CARE_PROVIDER_SITE_OTHER): Payer: Managed Care, Other (non HMO)

## 2018-02-19 DIAGNOSIS — Z125 Encounter for screening for malignant neoplasm of prostate: Secondary | ICD-10-CM | POA: Diagnosis not present

## 2018-02-19 DIAGNOSIS — I10 Essential (primary) hypertension: Secondary | ICD-10-CM

## 2018-02-19 LAB — COMPREHENSIVE METABOLIC PANEL WITH GFR
ALT: 27 U/L (ref 0–53)
AST: 23 U/L (ref 0–37)
Albumin: 4.6 g/dL (ref 3.5–5.2)
Alkaline Phosphatase: 30 U/L — ABNORMAL LOW (ref 39–117)
BUN: 21 mg/dL (ref 6–23)
CO2: 31 meq/L (ref 19–32)
Calcium: 10.3 mg/dL (ref 8.4–10.5)
Chloride: 99 meq/L (ref 96–112)
Creatinine, Ser: 1.13 mg/dL (ref 0.40–1.50)
GFR: 70.15 mL/min
Glucose, Bld: 122 mg/dL — ABNORMAL HIGH (ref 70–99)
Potassium: 4.7 meq/L (ref 3.5–5.1)
Sodium: 137 meq/L (ref 135–145)
Total Bilirubin: 0.6 mg/dL (ref 0.2–1.2)
Total Protein: 6.9 g/dL (ref 6.0–8.3)

## 2018-02-19 LAB — LIPID PANEL
Cholesterol: 126 mg/dL (ref 0–200)
HDL: 32 mg/dL — ABNORMAL LOW (ref >39.00)
LDL Cholesterol: 64 mg/dL (ref 0–99)
NONHDL: 93.71
Total CHOL/HDL Ratio: 4
Triglycerides: 151 mg/dL — ABNORMAL HIGH (ref 0.0–149.0)
VLDL: 30.2 mg/dL (ref 0.0–40.0)

## 2018-02-19 LAB — TSH: TSH: 3.36 u[IU]/mL (ref 0.35–4.50)

## 2018-02-19 LAB — PSA: PSA: 2.52 ng/mL (ref 0.10–4.00)

## 2018-02-25 ENCOUNTER — Other Ambulatory Visit: Payer: Self-pay | Admitting: Family Medicine

## 2018-03-05 ENCOUNTER — Ambulatory Visit (INDEPENDENT_AMBULATORY_CARE_PROVIDER_SITE_OTHER): Payer: Managed Care, Other (non HMO) | Admitting: Family Medicine

## 2018-03-05 ENCOUNTER — Encounter: Payer: Self-pay | Admitting: Family Medicine

## 2018-03-05 VITALS — BP 144/80 | HR 80 | Temp 98.3°F | Ht 68.0 in | Wt 187.5 lb

## 2018-03-05 DIAGNOSIS — G43909 Migraine, unspecified, not intractable, without status migrainosus: Secondary | ICD-10-CM

## 2018-03-05 DIAGNOSIS — Z23 Encounter for immunization: Secondary | ICD-10-CM | POA: Diagnosis not present

## 2018-03-05 DIAGNOSIS — Z0000 Encounter for general adult medical examination without abnormal findings: Secondary | ICD-10-CM | POA: Diagnosis not present

## 2018-03-05 DIAGNOSIS — R739 Hyperglycemia, unspecified: Secondary | ICD-10-CM

## 2018-03-05 DIAGNOSIS — E039 Hypothyroidism, unspecified: Secondary | ICD-10-CM

## 2018-03-05 DIAGNOSIS — I10 Essential (primary) hypertension: Secondary | ICD-10-CM

## 2018-03-05 DIAGNOSIS — Z7189 Other specified counseling: Secondary | ICD-10-CM

## 2018-03-05 DIAGNOSIS — R7309 Other abnormal glucose: Secondary | ICD-10-CM

## 2018-03-05 DIAGNOSIS — Z8042 Family history of malignant neoplasm of prostate: Secondary | ICD-10-CM

## 2018-03-05 DIAGNOSIS — Z125 Encounter for screening for malignant neoplasm of prostate: Secondary | ICD-10-CM

## 2018-03-05 DIAGNOSIS — E785 Hyperlipidemia, unspecified: Secondary | ICD-10-CM

## 2018-03-05 LAB — POCT GLYCOSYLATED HEMOGLOBIN (HGB A1C): HEMOGLOBIN A1C: 5.7 % — AB (ref 4.0–5.6)

## 2018-03-05 MED ORDER — TRIAMTERENE-HCTZ 37.5-25 MG PO TABS: 1.0000 | ORAL_TABLET | Freq: Every day | ORAL | 3 refills | Status: DC

## 2018-03-05 MED ORDER — FLUTICASONE PROPIONATE 50 MCG/ACT NA SUSP: 2.0000 | Freq: Every day | NASAL | 12 refills | Status: DC

## 2018-03-05 MED ORDER — LEVOTHYROXINE SODIUM 50 MCG PO TABS: 50.0000 ug | ORAL_TABLET | Freq: Every day | ORAL | 3 refills | Status: DC

## 2018-03-05 MED ORDER — TRIAMCINOLONE ACETONIDE 0.5 % EX CREA: 1.0000 "application " | TOPICAL_CREAM | Freq: Three times a day (TID) | CUTANEOUS | 1 refills | Status: AC | PRN

## 2018-03-05 NOTE — Progress Notes (Signed)
CPE- See plan.  Routine anticipatory guidance given to patient.  See health maintenance.  The possibility exists that previously documented standard health maintenance information may have been brought forward from a previous encounter into this note.  If needed, that same information has been updated to reflect the current situation based on today's encounter.    Tetanus 2015 Flu shot updated today.   PNA not due.   Shingles out of stock.   PSA wnl but elevated from prev.  FH noted.   Colonoscopy 2011 Living will d/w pt. Wife designated if patient were incapacitated.  Diet and exercise d/w pt HCV and HIV screening prev done. D/w pt.   Sugar elevation d/w pt.  He has been working on diet.  Intentional weight loss.  Oatmeal for breakfast and salads for lunch.  Not diabetic by A1c.  Discussed with patient at office visit.  Elevated Cholesterol: Using medications without problems: yes Muscle aches: no from statin, more likely from work Diet compliance: yes Exercise: yes  Hypertension:    Using medication without problems or lightheadedness: yes Chest pain with exertion:no Edema:no Short of breath:no Other issues: less migraines on propranolol.    Hypothyroidism. No dysphagia, no neck mass.  tsh wnl. Compliant.    PMH and SH reviewed  Meds, vitals, and allergies reviewed.   ROS: Per HPI.  Unless specifically indicated otherwise in HPI, the patient denies:  General: fever. Eyes: acute vision changes ENT: sore throat Cardiovascular: chest pain Respiratory: SOB GI: vomiting GU: dysuria Musculoskeletal: acute back pain Derm: acute rash Neuro: acute motor dysfunction Psych: worsening mood Endocrine: polydipsia Heme: bleeding Allergy: hayfever  GEN: nad, alert and oriented HEENT: mucous membranes moist NECK: supple w/o LA CV: rrr. PULM: ctab, no inc wob ABD: soft, +bs EXT: no edema SKIN: no acute rash

## 2018-03-05 NOTE — Patient Instructions (Addendum)
Don't change your meds for now.  If you get lightheaded (especially with weight loss), then let me know.  We may need to taper some of your BP medicine.  Recheck PSA in about 6 months.  Nonfasting lab visit.  Update me as needed.  Take care.  Glad to see you.

## 2018-03-06 NOTE — Assessment & Plan Note (Signed)
PSA wnl but elevated from prev.  FH noted.  Discussed with patient about recheck PSA in 6 months.  Ordered.  He agrees.  No LUTS.

## 2018-03-06 NOTE — Assessment & Plan Note (Signed)
TSH normal.  No adverse effect on medication.  Compliant.  No thyromegaly.  Continue as is.  He agrees.

## 2018-03-06 NOTE — Assessment & Plan Note (Signed)
Fewer headaches noted with beta-blocker.  Continue as is.

## 2018-03-06 NOTE — Assessment & Plan Note (Signed)
Continue work on diet and exercise.  Labs discussed with patient.  No change in meds at this point.  See after visit summary.

## 2018-03-06 NOTE — Assessment & Plan Note (Signed)
Sugar elevation d/w pt.  He has been working on diet.  Intentional weight loss.  Oatmeal for breakfast and salads for lunch.  Not diabetic by A1c.  Discussed with patient at office visit.

## 2018-03-06 NOTE — Assessment & Plan Note (Signed)
Living will d/w pt.  Wife designated if patient were incapacitated.   ?

## 2018-03-06 NOTE — Assessment & Plan Note (Signed)
Reasonable control.  Continue work on diet and exercise.  Continue statin.  He agrees.

## 2018-03-06 NOTE — Assessment & Plan Note (Signed)
Tetanus 2015 Flu shot updated today.   PNA not due.   Shingles out of stock.   PSA wnl but elevated from prev.  FH noted.   Colonoscopy 2011 Living will d/w pt. Wife designated if patient were incapacitated.  Diet and exercise d/w pt HCV and HIV screening prev done. D/w pt.

## 2018-03-08 ENCOUNTER — Ambulatory Visit (INDEPENDENT_AMBULATORY_CARE_PROVIDER_SITE_OTHER): Payer: Managed Care, Other (non HMO) | Admitting: Cardiology

## 2018-03-08 ENCOUNTER — Encounter: Payer: Self-pay | Admitting: Cardiology

## 2018-03-08 ENCOUNTER — Telehealth: Payer: Self-pay | Admitting: *Deleted

## 2018-03-08 VITALS — BP 128/82 | HR 68 | Ht 68.0 in | Wt 190.0 lb

## 2018-03-08 DIAGNOSIS — G4719 Other hypersomnia: Secondary | ICD-10-CM

## 2018-03-08 DIAGNOSIS — G4733 Obstructive sleep apnea (adult) (pediatric): Secondary | ICD-10-CM

## 2018-03-08 DIAGNOSIS — I10 Essential (primary) hypertension: Secondary | ICD-10-CM | POA: Diagnosis not present

## 2018-03-08 NOTE — Addendum Note (Signed)
Addended by: Sarina Ill on: 03/08/2018 02:29 PM   Modules accepted: Orders

## 2018-03-08 NOTE — Telephone Encounter (Signed)
-----   Message from Sarina Ill, RN sent at 03/08/2018  2:29 PM EDT ----- Regarding: Sleep Hello, Dr. Radford Pax ordered [facility], Airsense CPAP. Orders placed and please send. Thanks, [employer]

## 2018-03-08 NOTE — Telephone Encounter (Signed)
Order faxed to [facility] today

## 2018-03-08 NOTE — Progress Notes (Signed)
Cardiology Office Note:    Date:  03/08/2018   ID:  Adrian Dean, DOB November 16, 1955, MRN [id number]  PCP:  Tonia Ghent, MD  Cardiologist:  No primary care provider on file.    Referring MD: Tonia Ghent, MD   Chief Complaint  Patient presents with  . Sleep Apnea    History of Present Illness:    Adrian Dean is a 62 y.o. male with a hx of obstructive sleep apnea and uses a CPAP at home.  He recently told Dr. Harrington Challenger that he has problems getting sleepy during the day.  His wife stated that he could fall asleep at a rock concert and was worried that there may be a narcolepsy component to his sleep disorder.  He has not had a sleep test and multiple years.  In questioning him about his sleep hygiene he does not get much sleep on the weekends.  He has a son with muscular dystrophy who is severely handicapped.  They have a nurse that comes in and takes care of him on Sunday through Thursday night so that they can sleep but on Friday night and Saturday night they have to take care of him and he says he is up usually every 30 to 45 minutes.  During the week he goes to bed at 11 to 11:30 PM and falls asleep within 5 minutes but then is up at 3 AM.  This is pretty much the same sleep pattern the weekends except that he is up every 40 minutes as well to take care of his son.  He tolerates his nasal mask without any problems and denies any significant nasal congestion or mouth dryness.  He feels the pressure is adequate.  He does not have any symptoms that would yield concern for possible narcolepsy such as drop attacks.  Past Medical History:  Diagnosis Date  . Calculus of kidney   . Contact dermatitis and other eczema due to detergents   . Esophageal reflux 04/20/99   EGD  . Family history of malignant neoplasm of prostate   . Hiatal hernia   . History of fractured pelvis    fork lift accident- hospital, hip problem  . Macular degeneration    R eye 2015  . Migraine, unspecified,  without mention of intractable migraine without mention of status migrainosus   . Other abnormal glucose   . Other and unspecified hyperlipidemia   . Plantar fasciitis    left foot  . Pure hypercholesterolemia   . Unspecified alveolar and parietoalveolar pneumonopathy   . Unspecified essential hypertension   . Unspecified hypothyroidism   . Unspecified sleep apnea    on CPAP    Past Surgical History:  Procedure Laterality Date  . FOOT SURGERY     for plantar fasciitis    Current Medications: Current Meds  Medication Sig  . esomeprazole (NEXIUM) 20 MG capsule Take 20 mg by mouth daily at 12 noon.  . fenofibrate 160 MG tablet Take 1 tablet (160 mg total) by mouth daily.  . fluticasone (FLONASE) 50 MCG/ACT nasal spray Place 2 sprays into both nostrils daily.  Vanessa Kick Ethyl 1 g CAPS Take 2 capsules (2 g total) by mouth 2 (two) times daily.  Marland Kitchen levothyroxine (SYNTHROID) 50 MCG tablet Take 1 tablet (50 mcg total) by mouth daily.  Marland Kitchen loratadine (CLARITIN) 10 MG tablet Take 10 mg by mouth daily as needed for allergies.   . Multiple Vitamin (MULTIVITAMIN) tablet Take 1 tablet by mouth  daily.  . Multiple Vitamins-Minerals (PRESERVISION AREDS 2 PO) Take 1 capsule by mouth 2 (two) times daily.   . propranolol (INDERAL) 80 MG tablet TAKE 1 TABLET (80 MG TOTAL) BY MOUTH 2 (TWO) TIMES DAILY.  . rosuvastatin (CRESTOR) 10 MG tablet Take 1 tablet (10 mg total) by mouth daily.  Marland Kitchen triamcinolone cream (KENALOG) 0.5 % Apply 1 application topically 3 (three) times daily as needed.  . triamterene-hydrochlorothiazide (MAXZIDE-25) 37.5-25 MG tablet Take 1 tablet by mouth daily.     Allergies:   Codeine sulfate and Lipitor [atorvastatin]   Social History   Socioeconomic History  . Marital status: Married    Spouse name: Not on file  . Number of children: 1  . Years of education: Not on file  . Highest education level: Not on file  Occupational History  . Occupation: maintainence, Lexicographer: [employer]    Comment: exposure to [place]  . Financial resource strain: Not on file  . Food insecurity:    Worry: Not on file    Inability: Not on file  . Transportation needs:    Medical: Not on file    Non-medical: Not on file  Tobacco Use  . Smoking status: Former Research scientist (life sciences)  . Smokeless tobacco: Never Used  . Tobacco comment: In 29s  Substance and Sexual Activity  . Alcohol use: No  . Drug use: No  . Sexual activity: Not on file  Lifestyle  . Physical activity:    Days per week: Not on file    Minutes per session: Not on file  . Stress: Not on file  Relationships  . Social connections:    Talks on phone: Not on file    Gets together: Not on file    Attends religious service: Not on file    Active member of club or organization: Not on file    Attends meetings of clubs or organizations: Not on file    Relationship status: Not on file  Other Topics Concern  . Not on file  Social History Narrative   Married since 1982.     Cubs fan.     One child at home with muscular dystrophy.     No regular exercise.     [place].      Family History: The patient's family history includes Cancer in his brother and father; Cirrhosis in his mother; Heart disease in his father; Hypertension in his mother; Obesity in his mother; Prostate cancer in his father. There is no history of Diabetes, Depression, Drug abuse, Alcohol abuse, Stroke, or Colon cancer.  ROS:   Please see the history of present illness.    ROS  All other systems reviewed and negative.   EKGs/Labs/Other Studies Reviewed:    The following studies were reviewed today: no  EKG:  EKG is not ordered today.  Recent Labs: 02/19/2018: ALT 27; BUN 21; Creatinine, Ser 1.13; Potassium 4.7; Sodium 137; TSH 3.36   Recent Lipid Panel    Component Value Date/Time   CHOL 126 02/19/2018 0804   CHOL 188 12/13/2017 0736   TRIG 151.0 (H) 02/19/2018 0804   HDL 32.00 (L) 02/19/2018 0804    HDL 30 (L) 12/13/2017 0736   CHOLHDL 4 02/19/2018 0804   VLDL 30.2 02/19/2018 0804   LDLCALC 64 02/19/2018 0804   LDLCALC 112 (H) 12/13/2017 0736   LDLDIRECT 93.0 07/04/2017 0757    Physical Exam:    VS:  BP 128/82  Pulse 68   Ht 5\' 8"  (1.727 m)   Wt 190 lb (86.2 kg)   SpO2 97%   BMI 28.89 kg/m     Wt Readings from Last 3 Encounters:  03/08/18 190 lb (86.2 kg)  03/05/18 187 lb 8 oz (85 kg)  09/15/17 199 lb 1.9 oz (90.3 kg)     GEN:  Well nourished, well developed in no acute distress HEENT: Normal NECK: No JVD; No carotid bruits LYMPHATICS: No lymphadenopathy CARDIAC: RRR, no murmurs, rubs, gallops RESPIRATORY:  Clear to auscultation without rales, wheezing or rhonchi  ABDOMEN: Soft, non-tender, non-distended MUSCULOSKELETAL:  No edema; No deformity  SKIN: Warm and dry NEUROLOGIC:  Alert and oriented x 3 PSYCHIATRIC:  Normal affect   ASSESSMENT:    1. Obstructive sleep apnea syndrome   2. Essential hypertension   3. Excessive daytime sleepiness    PLAN:    In order of problems listed above:  1.  OSA -he has been on CPAP therapy for over 13 years.  He would like to get a new machine.  He tolerates his device and nasal mask without any problems.  I am going to order him an air since CPAP set on auto from 6 to 18 cm H2O.  I will get a download in 2 weeks.  He will see me back in 10 weeks per insurance requirements to document compliance.  I will get a copy of his old sleep study [facility] that was done about 10 years ago.  2.  HTN -BP is well controlled on exam today.  He will continue on propranolol 80 mg 2 times daily and Maxide 37.5-25 mg daily.  3.  Excessive daytime sleepiness -he does not have any symptoms that would be of concern for narcolepsy.  He has very poor sleep hygiene which I think is the etiology of his daytime sleepiness.  He says he has been sleepy for years and this is not changed recently.  He is only getting 3-1/2 to 4 hours asleep  during the week and on the weekends is much less because he has to take care of his handicapped son during the night and wakes up every 40 minutes to help him.  I do not think further testing for narcolepsy at this time is indicated.  I would like to get a download to make sure that his apnea is well treated if we get him on a new Pap device.  I have encouraged him to try to improve his sleep hygiene and try to get more hours of sleep.   Medication Adjustments/Labs and Tests Ordered: Current medicines are reviewed at length with the patient today.  Concerns regarding medicines are outlined above.  No orders of the defined types were placed in this encounter.  No orders of the defined types were placed in this encounter.   Signed, Fransico Him, MD  03/08/2018 1:53 PM    [place]

## 2018-03-08 NOTE — Patient Instructions (Signed)
Medication Instructions:  Your physician recommends that you continue on your current medications as directed. Please refer to the Current Medication list given to you today.  If you need a refill on your cardiac medications before your next appointment, please call your pharmacy.   Lab work:   If you have labs (blood work) drawn today and your tests are completely normal, you will receive your results only by: Marland Kitchen MyChart Message (if you have MyChart) OR . A paper copy in the mail If you have any lab test that is abnormal or we need to change your treatment, we will call you to review the results.  Follow-Up: 10 week after CPAP received.

## 2018-03-13 ENCOUNTER — Telehealth: Payer: Self-pay

## 2018-03-13 NOTE — Telephone Encounter (Signed)
Noted.  Patient has been added to the waiting list.

## 2018-03-13 NOTE — Telephone Encounter (Signed)
Copied from [place] [phone]. Topic: General - Inquiry >> Mar 12, 2018  2:44 PM Scherrie Gerlach wrote: Reason for CRM: pt would like you to put him on the wait list for the shingrix vaccine.

## 2018-03-14 NOTE — Telephone Encounter (Signed)
[facility] called to say the patient does not have out of net work benefits and they have forwarded his paperwork to [place].

## 2018-03-21 ENCOUNTER — Other Ambulatory Visit: Payer: Managed Care, Other (non HMO) | Admitting: *Deleted

## 2018-03-21 DIAGNOSIS — E7849 Other hyperlipidemia: Secondary | ICD-10-CM

## 2018-03-21 LAB — HEPATIC FUNCTION PANEL
ALT: 34 IU/L (ref 0–44)
AST: 28 IU/L (ref 0–40)
Albumin: 4.6 g/dL (ref 3.6–4.8)
Alkaline Phosphatase: 32 IU/L — ABNORMAL LOW (ref 39–117)
Bilirubin Total: 0.5 mg/dL (ref 0.0–1.2)
Bilirubin, Direct: 0.15 mg/dL (ref 0.00–0.40)
TOTAL PROTEIN: 6.6 g/dL (ref 6.0–8.5)

## 2018-03-21 LAB — LIPID PANEL
CHOL/HDL RATIO: 4.1 ratio (ref 0.0–5.0)
CHOLESTEROL TOTAL: 140 mg/dL (ref 100–199)
HDL: 34 mg/dL — AB (ref >39)
LDL Calculated: 79 mg/dL (ref 0–99)
Triglycerides: 136 mg/dL (ref 0–149)
VLDL Cholesterol Cal: 27 mg/dL (ref 5–40)

## 2018-05-01 ENCOUNTER — Ambulatory Visit (INDEPENDENT_AMBULATORY_CARE_PROVIDER_SITE_OTHER): Payer: Managed Care, Other (non HMO)

## 2018-05-01 DIAGNOSIS — Z23 Encounter for immunization: Secondary | ICD-10-CM | POA: Diagnosis not present

## 2018-06-01 ENCOUNTER — Encounter: Payer: Self-pay | Admitting: Cardiology

## 2018-06-06 ENCOUNTER — Encounter: Payer: Self-pay | Admitting: Family Medicine

## 2018-06-06 ENCOUNTER — Telehealth: Payer: Self-pay | Admitting: Cardiology

## 2018-06-06 ENCOUNTER — Ambulatory Visit (INDEPENDENT_AMBULATORY_CARE_PROVIDER_SITE_OTHER): Payer: Managed Care, Other (non HMO) | Admitting: Family Medicine

## 2018-06-06 ENCOUNTER — Telehealth: Payer: Self-pay

## 2018-06-06 VITALS — BP 132/86 | HR 72 | Temp 98.2°F | Ht 68.0 in | Wt 197.0 lb

## 2018-06-06 DIAGNOSIS — J209 Acute bronchitis, unspecified: Secondary | ICD-10-CM | POA: Diagnosis not present

## 2018-06-06 MED ORDER — BENZONATATE 100 MG PO CAPS: 100.0000 mg | ORAL_CAPSULE | Freq: Three times a day (TID) | ORAL | 0 refills | Status: DC | PRN

## 2018-06-06 MED ORDER — HYDROCODONE-HOMATROPINE 5-1.5 MG/5ML PO SYRP: 5.0000 mL | ORAL_SOLUTION | Freq: Every evening | ORAL | 0 refills | Status: DC | PRN

## 2018-06-06 NOTE — Telephone Encounter (Signed)
[place] Night - Client Nonclinical Telephone Record [place] Night - Client Client Site [facility] Physician Renford Dills - MD Contact Type Call Who Is Calling Patient / Member / Family / Caregiver Caller Name Kessler Solly Caller Phone Number [phone] Patient Name Adrian Dean Patient DOB 1956/04/11 Call Type Message Only Information Provided Reason for Call Request to Schedule Office Appointment Initial Comment Requesting to schedule an appointment. Additional Comment Provided Office Hours. Call Closed By: Erich Montane Transaction Date/Time: 06/06/2018 8:00:34 AM (ET)

## 2018-06-06 NOTE — Telephone Encounter (Signed)
New Message    Pt c/o medication issue:  1. Name of Medication: Vaszepa   2. How are you currently taking this medication (dosage and times per day)? 1gm 2 capsules twice a day   3. Are you having a reaction (difficulty breathing--STAT)? No  4. What is your medication issue?  Patient states he received phone call from insurance stating his medication coverage has changed and needs prior authorization for [facility] medication.  Needs the doctor to start authorization to continue getting the medication.

## 2018-06-06 NOTE — Telephone Encounter (Signed)
Pt calling stating that he needs a prior auth. Please address

## 2018-06-06 NOTE — Telephone Encounter (Signed)
Pt already has appt 06/06/18 at 12pm with Dr Danise Mina.

## 2018-06-06 NOTE — Patient Instructions (Signed)
I think you have bronchitis but likely viral - should improve with time.  Push fluids and plenty of rest.  Continue mucinex with a large glass of water to help break up the mucous.  Take tessalon perls as needed during the day, and hydrocodone cough syrup at night time to help you rest.  Let us know if fever >101 or worsening productive cough.

## 2018-06-06 NOTE — Progress Notes (Signed)
BP 132/86 (BP Location: Left Arm, Patient Position: Sitting, Cuff Size: Normal)   Pulse 72   Temp 98.2 F (36.8 C) (Oral)   Ht 5\' 8"  (1.727 m)   Wt 197 lb (89.4 kg)   SpO2 97%   BMI 29.95 kg/m    CC: URI symptoms Subjective:    Patient ID: Adrian Dean, male    DOB: 1956/03/04, 63 y.o.   MRN: [id number]  HPI: Adrian Dean is a 63 y.o. male presenting on 06/06/2018 for Cough (C/o cough, chest congestion and HA. Sxs started about 3 days ago. Tried Mucinex and Claritin, barely helpful. )   3d h/o productive cough, chest congestion, HA, wheezing. Body aches and chest pain from coughing. Chest > head congestion. Cough keeping him up at night  No fevers/chills, ear or tooth pain, ST, PNDrainage., dyspnea,. Tried mucinex and claritin with minimal benefit.   Cares for handicapped son (muscular dystrophy).   Wife sick at home for past 2 wks but is starting to feel better.   Non smoker No h/o asthma or COPD.  He is Building control surveyor.      Relevant past medical, surgical, family and social history reviewed and updated as indicated. Interim medical history since our last visit reviewed. Allergies and medications reviewed and updated. Outpatient Medications Prior to Visit  Medication Sig Dispense Refill  . esomeprazole (NEXIUM) 20 MG capsule Take 20 mg by mouth daily at 12 noon.    . fenofibrate 160 MG tablet Take 1 tablet (160 mg total) by mouth daily. 90 tablet 3  . Icosapent Ethyl 1 g CAPS Take 2 capsules (2 g total) by mouth 2 (two) times daily. 120 capsule 11  . levothyroxine (SYNTHROID) 50 MCG tablet Take 1 tablet (50 mcg total) by mouth daily. 90 tablet 3  . loratadine (CLARITIN) 10 MG tablet Take 10 mg by mouth daily as needed for allergies.     . Multiple Vitamin (MULTIVITAMIN) tablet Take 1 tablet by mouth daily.    . Multiple Vitamins-Minerals (PRESERVISION AREDS 2 PO) Take 1 capsule by mouth 2 (two) times daily.     . propranolol (INDERAL) 80 MG tablet TAKE 1 TABLET (80 MG  TOTAL) BY MOUTH 2 (TWO) TIMES DAILY. 180 tablet 4  . rosuvastatin (CRESTOR) 10 MG tablet Take 1 tablet (10 mg total) by mouth daily. 90 tablet 3  . triamcinolone cream (KENALOG) 0.5 % Apply 1 application topically 3 (three) times daily as needed. 30 g 1  . triamterene-hydrochlorothiazide (MAXZIDE-25) 37.5-25 MG tablet Take 1 tablet by mouth daily. 90 tablet 3  . fluticasone (FLONASE) 50 MCG/ACT nasal spray Place 2 sprays into both nostrils daily. (Patient not taking: Reported on 06/06/2018) 16 g 12   No facility-administered medications prior to visit.      Per HPI unless specifically indicated in ROS section below Review of Systems Objective:    BP 132/86 (BP Location: Left Arm, Patient Position: Sitting, Cuff Size: Normal)   Pulse 72   Temp 98.2 F (36.8 C) (Oral)   Ht 5\' 8"  (1.727 m)   Wt 197 lb (89.4 kg)   SpO2 97%   BMI 29.95 kg/m   Wt Readings from Last 3 Encounters:  06/06/18 197 lb (89.4 kg)  03/08/18 190 lb (86.2 kg)  03/05/18 187 lb 8 oz (85 kg)    Physical Exam Vitals signs and nursing note reviewed.  Constitutional:      General: He is not in acute distress.    Appearance: Normal appearance.  He is well-developed.  HENT:     Head: Normocephalic and atraumatic.     Right Ear: Hearing, tympanic membrane, ear canal and external ear normal.     Left Ear: Hearing, tympanic membrane, ear canal and external ear normal.     Nose: Mucosal edema (nasal mucosal congestion) and congestion present. No rhinorrhea.     Right Sinus: Maxillary sinus tenderness and frontal sinus tenderness present.     Left Sinus: Maxillary sinus tenderness and frontal sinus tenderness present.     Mouth/Throat:     Mouth: Mucous membranes are moist.     Pharynx: Oropharynx is clear. Uvula midline. No oropharyngeal exudate or posterior oropharyngeal erythema.     Tonsils: No tonsillar abscesses.  Eyes:     General: No scleral icterus.    Conjunctiva/sclera: Conjunctivae normal.     Pupils:  Pupils are equal, round, and reactive to light.  Neck:     Musculoskeletal: Normal range of motion and neck supple.  Cardiovascular:     Rate and Rhythm: Normal rate and regular rhythm.     Heart sounds: Normal heart sounds. No murmur.  Pulmonary:     Effort: Pulmonary effort is normal. No respiratory distress.     Breath sounds: Normal breath sounds. No wheezing or rales.     Comments: Lungs clear, deep cough present Lymphadenopathy:     Cervical: No cervical adenopathy.  Skin:    General: Skin is warm and dry.     Findings: No rash.  Neurological:     Mental Status: He is alert.       Assessment & Plan:   Problem List Items Addressed This Visit    Acute bronchitis - Primary    Anticipate viral given short duration and no localizing symptoms. Codeine intolerance (HA) will trial hycodan and tessalon perls.  Supportive care reviewed as per instructions. Red flags to seek further care reviewed.           Meds ordered this encounter  Medications  . benzonatate (TESSALON) 100 MG capsule    Sig: Take 1 capsule (100 mg total) by mouth 3 (three) times daily as needed for cough.    Dispense:  30 capsule    Refill:  0  . HYDROcodone-homatropine (HYCODAN) 5-1.5 MG/5ML syrup    Sig: Take 5 mLs by mouth at bedtime as needed for cough (sedation precautions).    Dispense:  120 mL    Refill:  0   No orders of the defined types were placed in this encounter.  Patient Instructions  I think you have bronchitis but likely viral - should improve with time.  Push fluids and plenty of rest.  Continue mucinex with a large glass of water to help break up the mucous.  Take tessalon perls as needed during the day, and hydrocodone cough syrup at night time to help you rest.  Let us know if fever >101 or worsening productive cough.    Follow up plan: No follow-ups on file.  Ria Bush, MD

## 2018-06-06 NOTE — Assessment & Plan Note (Signed)
Anticipate viral given short duration and no localizing symptoms. Codeine intolerance (HA) will trial hycodan and tessalon perls.  Supportive care reviewed as per instructions. Red flags to seek further care reviewed.

## 2018-06-11 NOTE — Telephone Encounter (Signed)
**Note De-Identified Adrian Dean Obfuscation** I have done a Vascepa PA through covermymeds. Key: [facility]

## 2018-06-18 ENCOUNTER — Telehealth: Payer: Self-pay | Admitting: Family Medicine

## 2018-06-18 NOTE — Telephone Encounter (Signed)
Patient has a 10 week follow up appointment scheduled for 06/18/2018. Marland KitchenPatient understands he needs to keep this appointment for insurance compliance. Patient was grateful for the call and thanked me.

## 2018-06-18 NOTE — Telephone Encounter (Signed)
Pt stated he is still sick with congestion and coughing and want an antibiotic. Please advise

## 2018-06-18 NOTE — Progress Notes (Signed)
Cardiology Office Note:    Date:  06/19/2018   ID:  Adrian Dean, DOB 12/06/1955, MRN [id number]  PCP:  Tonia Ghent, MD  Cardiologist:  No primary care provider on file.    Referring MD: Tonia Ghent, MD   Chief Complaint  Patient presents with  . Sleep Apnea    History of Present Illness:    Adrian Dean is a 63 y.o. male with a hx of mild OSA with an AHI of 13/hr and O2 desats as low as 82%.  He has been on CPAP therapy and when I saw him last he wanted a new machine.  He was ordered a Airsense PAP on auto and is now here for followup.  He is doing well with his CPAP device and thinks that he has gotten used to it.  He tolerates the mask and feels the pressure is adequate.  Since going on CPAP he still feels tired in the am but as stated in prior OV, he has very poor sleep hygiene only getting 3.5-4 hours of sleep during the week due to taking care of his handicapped son.  He denies any significant mouth or nasal dryness or nasal congestion.  He does not think that he snores.     Past Medical History:  Diagnosis Date  . Calculus of kidney   . Contact dermatitis and other eczema due to detergents   . Esophageal reflux 04/20/99   EGD  . Family history of malignant neoplasm of prostate   . Hiatal hernia   . History of fractured pelvis    fork lift accident- hospital, hip problem  . Macular degeneration    R eye 2015  . Migraine, unspecified, without mention of intractable migraine without mention of status migrainosus   . Other abnormal glucose   . Other and unspecified hyperlipidemia   . Plantar fasciitis    left foot  . Pure hypercholesterolemia   . Unspecified alveolar and parietoalveolar pneumonopathy   . Unspecified essential hypertension   . Unspecified hypothyroidism   . Unspecified sleep apnea    on CPAP    Past Surgical History:  Procedure Laterality Date  . FOOT SURGERY     for plantar fasciitis    Current Medications: Current Meds    Medication Sig  . benzonatate (TESSALON) 100 MG capsule Take 1 capsule (100 mg total) by mouth 3 (three) times daily as needed for cough.  . esomeprazole (NEXIUM) 20 MG capsule Take 20 mg by mouth daily at 12 noon.  . fenofibrate 160 MG tablet Take 1 tablet (160 mg total) by mouth daily.  Marland Kitchen HYDROcodone-homatropine (HYCODAN) 5-1.5 MG/5ML syrup Take 5 mLs by mouth at bedtime as needed for cough (sedation precautions).  Vanessa Kick Ethyl 1 g CAPS Take 2 capsules (2 g total) by mouth 2 (two) times daily.  Marland Kitchen levothyroxine (SYNTHROID) 50 MCG tablet Take 1 tablet (50 mcg total) by mouth daily.  Marland Kitchen loratadine (CLARITIN) 10 MG tablet Take 10 mg by mouth daily as needed for allergies.   . Multiple Vitamin (MULTIVITAMIN) tablet Take 1 tablet by mouth daily.  . Multiple Vitamins-Minerals (PRESERVISION AREDS 2 PO) Take 1 capsule by mouth 2 (two) times daily.   . propranolol (INDERAL) 80 MG tablet TAKE 1 TABLET (80 MG TOTAL) BY MOUTH 2 (TWO) TIMES DAILY.  . rosuvastatin (CRESTOR) 10 MG tablet Take 1 tablet (10 mg total) by mouth daily.  Marland Kitchen triamcinolone cream (KENALOG) 0.5 % Apply 1 application topically 3 (three)  times daily as needed.  . triamterene-hydrochlorothiazide (MAXZIDE-25) 37.5-25 MG tablet Take 1 tablet by mouth daily.     Allergies:   Codeine sulfate and Lipitor [atorvastatin]   Social History   Socioeconomic History  . Marital status: Married    Spouse name: Not on file  . Number of children: 1  . Years of education: Not on file  . Highest education level: Not on file  Occupational History  . Occupation: maintainence, Company secretary: [employer]    Comment: exposure to [place]  . Financial resource strain: Not on file  . Food insecurity:    Worry: Not on file    Inability: Not on file  . Transportation needs:    Medical: Not on file    Non-medical: Not on file  Tobacco Use  . Smoking status: Former Research scientist (life sciences)  . Smokeless tobacco: Never Used  .  Tobacco comment: In 28s  Substance and Sexual Activity  . Alcohol use: No  . Drug use: No  . Sexual activity: Not on file  Lifestyle  . Physical activity:    Days per week: Not on file    Minutes per session: Not on file  . Stress: Not on file  Relationships  . Social connections:    Talks on phone: Not on file    Gets together: Not on file    Attends religious service: Not on file    Active member of club or organization: Not on file    Attends meetings of clubs or organizations: Not on file    Relationship status: Not on file  Other Topics Concern  . Not on file  Social History Narrative   Married since 1982.     Cubs fan.     One child at home with muscular dystrophy.     No regular exercise.     [place].      Family History: The patient's family history includes Cancer in his brother and father; Cirrhosis in his mother; Heart disease in his father; Hypertension in his mother; Obesity in his mother; Prostate cancer in his father. There is no history of Diabetes, Depression, Drug abuse, Alcohol abuse, Stroke, or Colon cancer.  ROS:   Please see the history of present illness.    ROS  All other systems reviewed and negative.   EKGs/Labs/Other Studies Reviewed:    The following studies were reviewed today: PAP download  EKG:  EKG is not ordered today.    Recent Labs: 02/19/2018: BUN 21; Creatinine, Ser 1.13; Potassium 4.7; Sodium 137; TSH 3.36 03/21/2018: ALT 34   Recent Lipid Panel    Component Value Date/Time   CHOL 140 03/21/2018 0738   TRIG 136 03/21/2018 0738   HDL 34 (L) 03/21/2018 0738   CHOLHDL 4.1 03/21/2018 0738   CHOLHDL 4 02/19/2018 0804   VLDL 30.2 02/19/2018 0804   LDLCALC 79 03/21/2018 0738   LDLDIRECT 93.0 07/04/2017 0757    Physical Exam:    VS:  BP 122/84   Pulse 67   Ht 5\' 8"  (1.727 m)   Wt 196 lb (88.9 kg)   SpO2 97%   BMI 29.80 kg/m     Wt Readings from Last 3 Encounters:  06/19/18 196 lb (88.9 kg)  06/06/18 197 lb (89.4 kg)   03/08/18 190 lb (86.2 kg)     GEN:  Well nourished, well developed in no acute distress HEENT: Normal NECK: No JVD; No carotid bruits LYMPHATICS: No lymphadenopathy  CARDIAC: RRR, no murmurs, rubs, gallops RESPIRATORY:  Clear to auscultation without rales, wheezing or rhonchi  ABDOMEN: Soft, non-tender, non-distended MUSCULOSKELETAL:  No edema; No deformity  SKIN: Warm and dry NEUROLOGIC:  Alert and oriented x 3 PSYCHIATRIC:  Normal affect   ASSESSMENT:    1. Obstructive sleep apnea syndrome   2. Essential hypertension   3. Excessive daytime sleepiness    PLAN:    In order of problems listed above:  1.  OSA - the patient is tolerating PAP therapy well without any problems. The PAP download was reviewed today and showed an AHI of 0.9/hr on Auto PAP with 60% compliance for median usage on days used at 4 hours 9 minutes but average usage on days used was 5 hours 41 minutes.  I do not think his device calculated his daily usage correctly as it appears that he is 100% compliant and using more than 4 hours nightly..  The patient has been using and benefiting from PAP use and will continue to benefit from therapy.    2.  HTN - BP is controlled on exam today.  He will continue on Maxide 37.5-25mg  daily and Inderal 80mg  BID.   3.  Excessive daytime sleepiness - this is due to poor sleep hygiene caring for his handicapped son at night.    Medication Adjustments/Labs and Tests Ordered: Current medicines are reviewed at length with the patient today.  Concerns regarding medicines are outlined above.  No orders of the defined types were placed in this encounter.  No orders of the defined types were placed in this encounter.   Signed, Fransico Him, MD  06/19/2018 8:23 AM    [place]

## 2018-06-18 NOTE — Telephone Encounter (Signed)
Upon patient request DME selection is [facility] Patient understands he will be contacted by [facility] to set up his cpap. Patient understands to call if [facility] does not contact him with new setup in a timely manner. Patient understands they will be called once confirmation has been received from [facility] that they have received their new machine to schedule 10 week follow up appointment.   [facility] notified of new cpap order  Please add to airview Patient was grateful for the call and thanked me  

## 2018-06-19 ENCOUNTER — Ambulatory Visit: Payer: Managed Care, Other (non HMO) | Admitting: Cardiology

## 2018-06-19 ENCOUNTER — Ambulatory Visit (INDEPENDENT_AMBULATORY_CARE_PROVIDER_SITE_OTHER): Payer: Managed Care, Other (non HMO) | Admitting: Cardiology

## 2018-06-19 ENCOUNTER — Encounter: Payer: Self-pay | Admitting: Cardiology

## 2018-06-19 VITALS — BP 122/84 | HR 67 | Ht 68.0 in | Wt 196.0 lb

## 2018-06-19 DIAGNOSIS — G4719 Other hypersomnia: Secondary | ICD-10-CM | POA: Diagnosis not present

## 2018-06-19 DIAGNOSIS — G4733 Obstructive sleep apnea (adult) (pediatric): Secondary | ICD-10-CM

## 2018-06-19 DIAGNOSIS — I10 Essential (primary) hypertension: Secondary | ICD-10-CM

## 2018-06-19 MED ORDER — DOXYCYCLINE HYCLATE 100 MG PO TABS: 100.0000 mg | ORAL_TABLET | Freq: Two times a day (BID) | ORAL | 0 refills | Status: DC

## 2018-06-19 NOTE — Telephone Encounter (Signed)
Please triage this.  Please see what details you can get.  Let me know and I'll work on it.  Thanks.

## 2018-06-19 NOTE — Telephone Encounter (Signed)
Patient says he has coughed until his chest and back is sore, no sore throat but he is coughing up green mucous.  Maybe started out with some sinus congestion but quickly progressed to chest congestion, no known fevers.  Patient saw Dr. Darnell Level last week and was diagnosed with bronchitis and was given Tessalon and told to take Mucinex.  Patient has been doing that but is not getting better.  Patient is scheduled for jury duty next week and also is trying not to give it to his son Merrily Pew).

## 2018-06-19 NOTE — Patient Instructions (Signed)

## 2018-06-19 NOTE — Telephone Encounter (Signed)
Patient advised.

## 2018-06-19 NOTE — Telephone Encounter (Signed)
Would start doxy given the sx and timeline.  rx sent.  Update me if getting worse.  Thanks.

## 2018-06-20 ENCOUNTER — Encounter: Payer: Self-pay | Admitting: Family Medicine

## 2018-06-20 DIAGNOSIS — C4491 Basal cell carcinoma of skin, unspecified: Secondary | ICD-10-CM | POA: Insufficient documentation

## 2018-06-20 DIAGNOSIS — C4492 Squamous cell carcinoma of skin, unspecified: Secondary | ICD-10-CM | POA: Insufficient documentation

## 2018-07-03 ENCOUNTER — Encounter: Payer: Self-pay | Admitting: Family Medicine

## 2018-07-09 NOTE — Telephone Encounter (Signed)
The following message was received from Covermymeds: Adrian Dean (Key: [id number]) Vascepa 1GM capsules    Librarian, academic PA Form Created 28 days ago Sent to Plan 28 days ago Plan Response 28 days ago Submit Clinical Questions 28 days ago Determination Favorable 28 days ago Message from Plan PA Case: [id number], Status: Approved, Coverage Starts on: 06/11/2018 12:00:00 AM, Coverage Ends on: 06/11/2019 12:00:00 AM.

## 2018-07-10 ENCOUNTER — Ambulatory Visit: Payer: Managed Care, Other (non HMO)

## 2018-07-12 ENCOUNTER — Ambulatory Visit: Payer: Managed Care, Other (non HMO)

## 2018-07-12 DIAGNOSIS — Z23 Encounter for immunization: Secondary | ICD-10-CM

## 2018-09-04 ENCOUNTER — Other Ambulatory Visit: Payer: Self-pay

## 2018-09-04 ENCOUNTER — Other Ambulatory Visit (INDEPENDENT_AMBULATORY_CARE_PROVIDER_SITE_OTHER): Payer: Managed Care, Other (non HMO)

## 2018-09-04 ENCOUNTER — Telehealth: Payer: Self-pay | Admitting: Family Medicine

## 2018-09-04 ENCOUNTER — Encounter: Payer: Self-pay | Admitting: Family Medicine

## 2018-09-04 DIAGNOSIS — Z125 Encounter for screening for malignant neoplasm of prostate: Secondary | ICD-10-CM

## 2018-09-04 LAB — PSA: PSA: 1.82 ng/mL (ref 0.10–4.00)

## 2018-09-04 NOTE — Telephone Encounter (Signed)
Pt given labs results, verbalized understanding.

## 2018-09-04 NOTE — Telephone Encounter (Signed)
Patient returned call about his lab results.

## 2018-09-07 ENCOUNTER — Other Ambulatory Visit: Payer: Self-pay | Admitting: Internal Medicine

## 2018-09-09 ENCOUNTER — Other Ambulatory Visit: Payer: Self-pay | Admitting: Internal Medicine

## 2018-10-30 IMAGING — CT CT CHEST W/ CM
2 of 3 series · 15 of 36 positions shown, 18 images · IV contrast (ISOVUE 300)
Comparison: Chest x-ray 08/22/2017.  Chest CT 12/03/2007

CLINICAL DATA: Left chest wall pain.  Former smoker.

EXAM:
CT CHEST WITH CONTRAST
TECHNIQUE: Multidetector CT imaging of the chest was performed during
intravenous contrast administration.
CONTRAST:  80mL [id number] IOPAMIDOL ([id number]) INJECTION 61%

[Series 2: thorax · axial · 0.80mm/px · z∈[-300,-36]mm · 12 of 156 slices shown, 15 images]
[im 12/156  mediastinal]
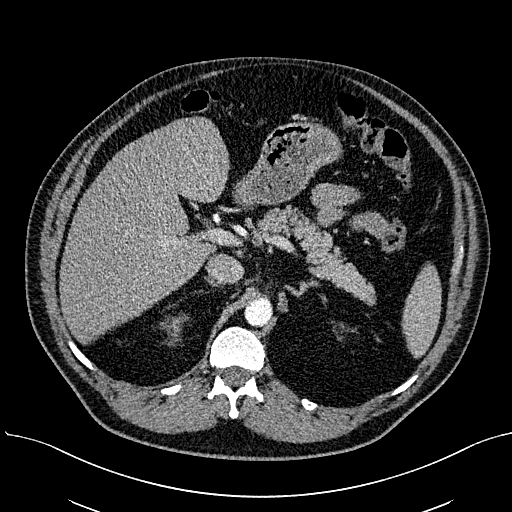
[im 12/156  lung]
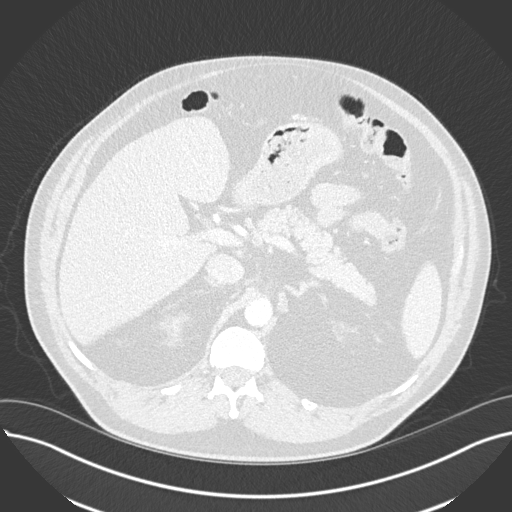
[im 23/156  lung]
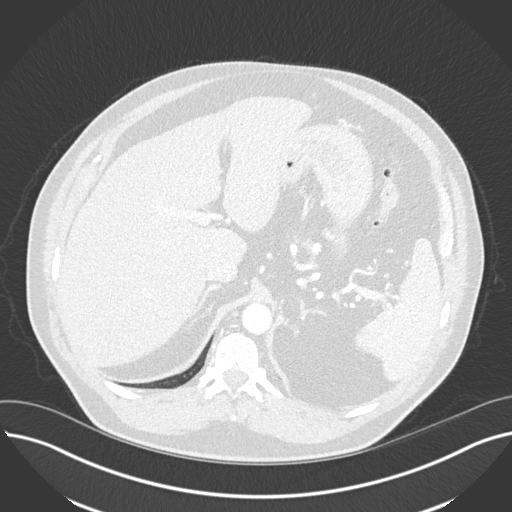
[im 35/156  lung]
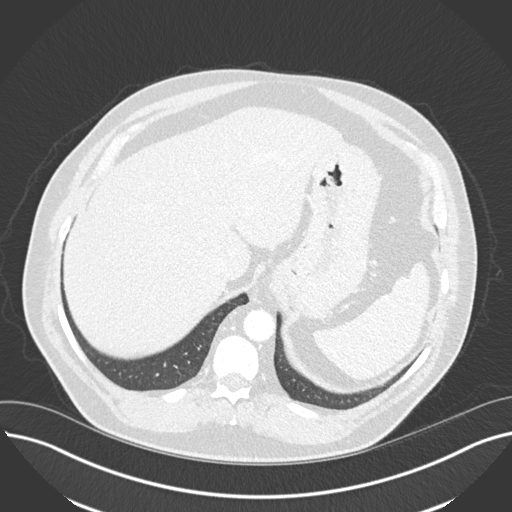
[im 46/156  lung]
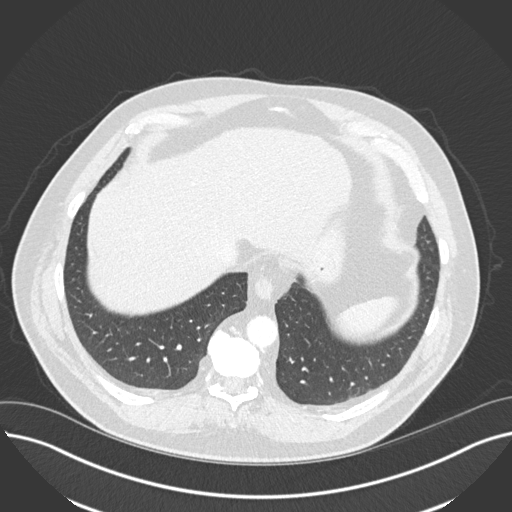
[im 58/156  mediastinal]
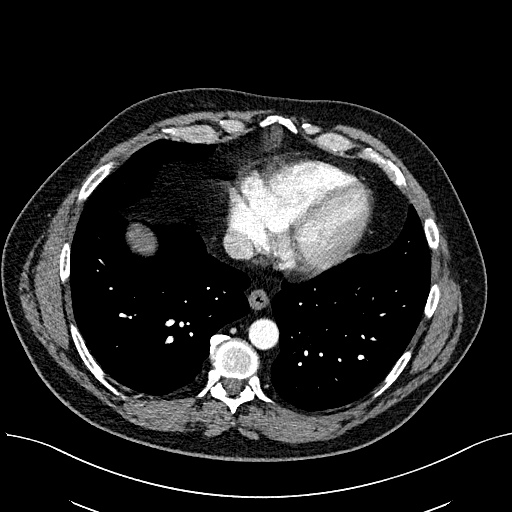
[im 58/156  lung]
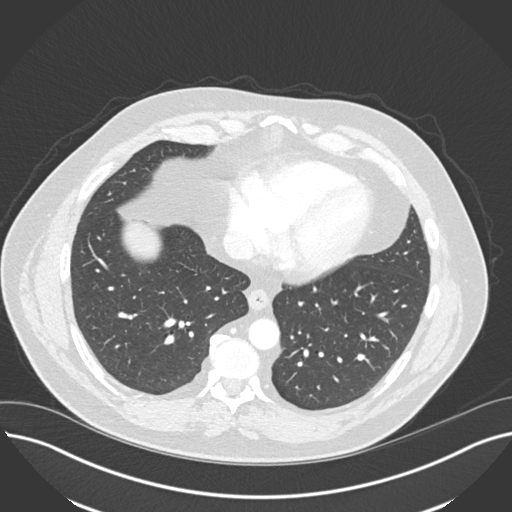
[im 69/156  lung]
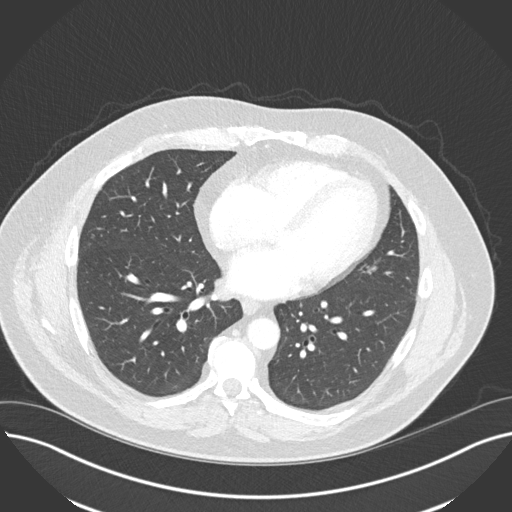
[im 87/156  lung]
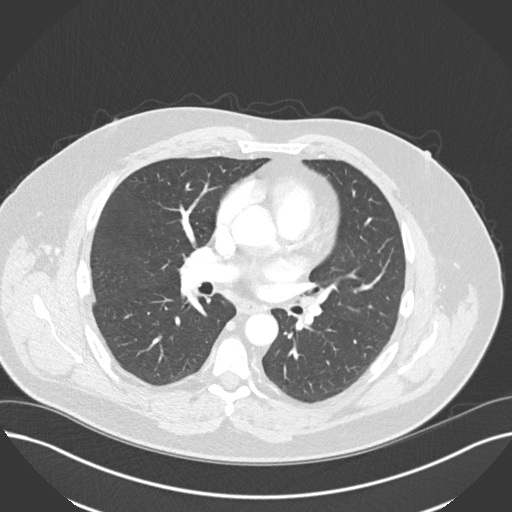
[im 98/156  lung]
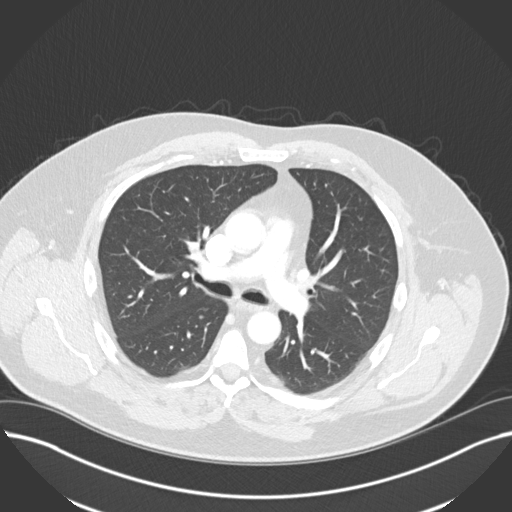
[im 110/156  mediastinal]
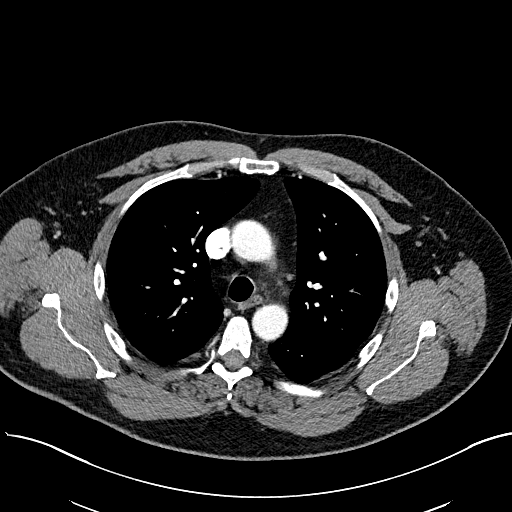
[im 110/156  lung]
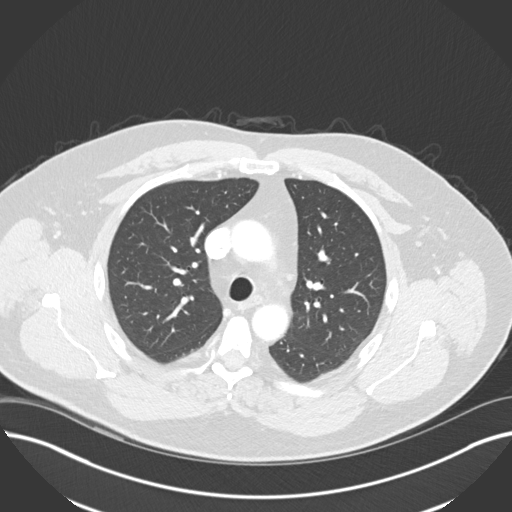
[im 121/156  lung]
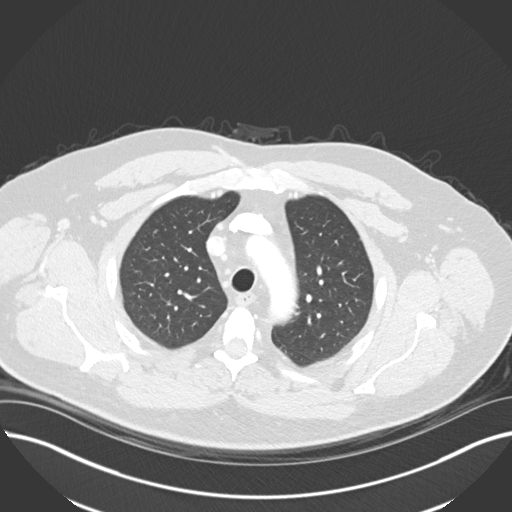
[im 133/156  lung]
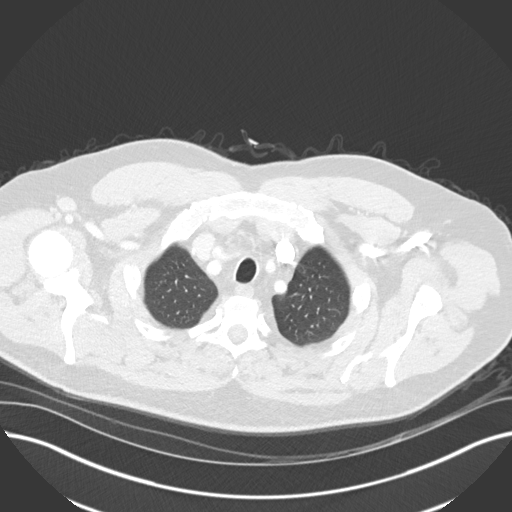
[im 144/156  lung]
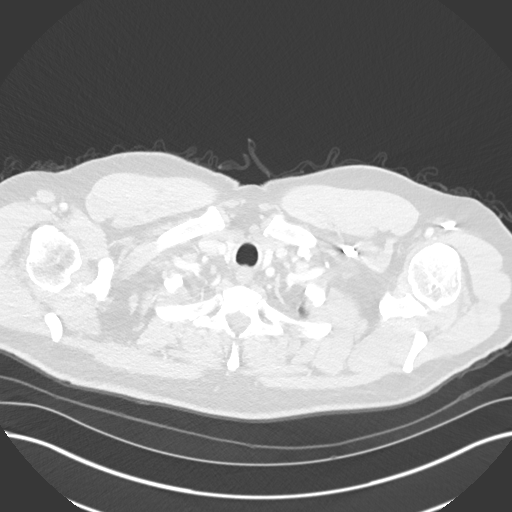

[Series 5: coronal · coronal · 0.62mm/px · 3 of 135 slices shown]
[im 27/135  lung]
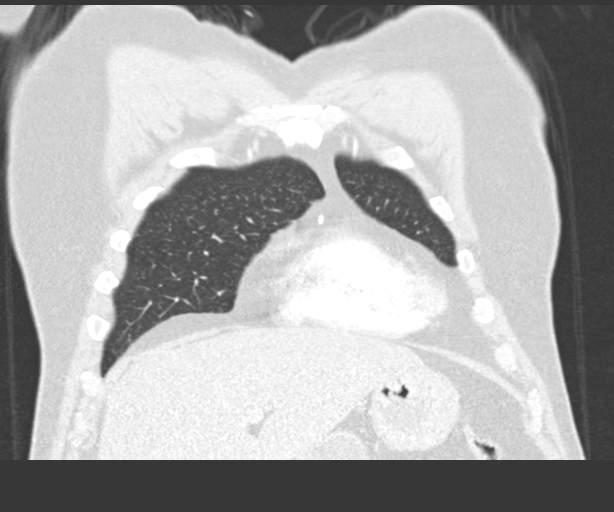
[im 54/135  lung]
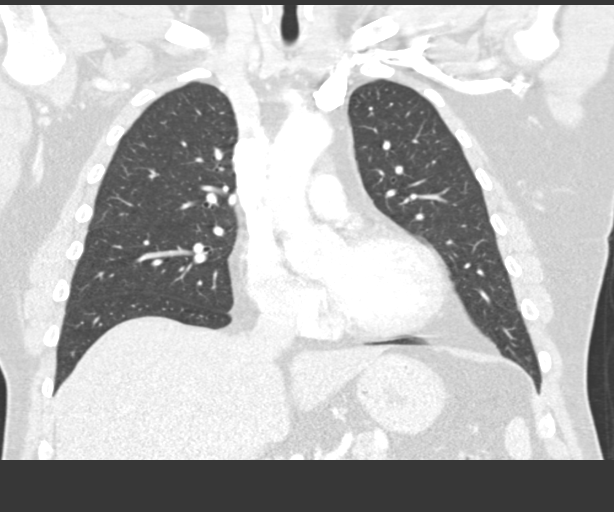
[im 81/135  lung]
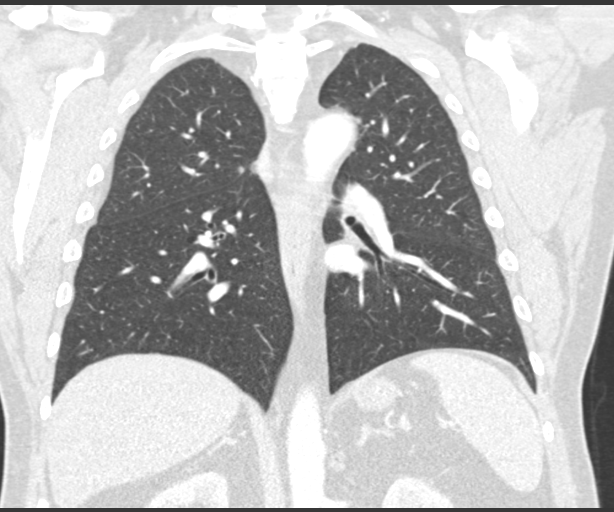

[15 of 36 positions shown; findings below may reference images not displayed]

FINDINGS: Cardiovascular: Scattered coronary artery calcifications in the left
anterior descending coronary artery. Heart is borderline in size.
Aorta is normal caliber.

Mediastinum/Nodes: No mediastinal, hilar, or axillary adenopathy.
Trachea and esophagus are unremarkable.

Lungs/Pleura: Visualized lungs clear.  No effusions.

Upper Abdomen: Imaging into the upper abdomen shows no acute
findings.

Musculoskeletal: Chest wall soft tissues are unremarkable. No acute
bony abnormality or focal bone lesion. Mild degenerative spurring in
the thoracic spine.
IMPRESSION: No acute cardiopulmonary disease.

Scattered coronary artery calcifications in the left anterior
descending coronary artery.

## 2018-12-09 ENCOUNTER — Other Ambulatory Visit: Payer: Self-pay | Admitting: Internal Medicine

## 2019-01-24 ENCOUNTER — Ambulatory Visit (INDEPENDENT_AMBULATORY_CARE_PROVIDER_SITE_OTHER): Payer: Managed Care, Other (non HMO)

## 2019-01-24 DIAGNOSIS — Z23 Encounter for immunization: Secondary | ICD-10-CM

## 2019-02-09 ENCOUNTER — Other Ambulatory Visit: Payer: Self-pay | Admitting: Family Medicine

## 2019-03-03 ENCOUNTER — Other Ambulatory Visit: Payer: Self-pay | Admitting: Internal Medicine

## 2019-03-06 ENCOUNTER — Other Ambulatory Visit: Payer: Self-pay

## 2019-03-06 ENCOUNTER — Other Ambulatory Visit: Payer: Self-pay | Admitting: Family Medicine

## 2019-03-06 ENCOUNTER — Other Ambulatory Visit (INDEPENDENT_AMBULATORY_CARE_PROVIDER_SITE_OTHER): Payer: Managed Care, Other (non HMO)

## 2019-03-06 DIAGNOSIS — E039 Hypothyroidism, unspecified: Secondary | ICD-10-CM

## 2019-03-06 DIAGNOSIS — E785 Hyperlipidemia, unspecified: Secondary | ICD-10-CM

## 2019-03-06 DIAGNOSIS — R739 Hyperglycemia, unspecified: Secondary | ICD-10-CM

## 2019-03-06 LAB — COMPREHENSIVE METABOLIC PANEL
ALT: 35 U/L (ref 0–53)
AST: 26 U/L (ref 0–37)
Albumin: 4.6 g/dL (ref 3.5–5.2)
Alkaline Phosphatase: 31 U/L — ABNORMAL LOW (ref 39–117)
BUN: 17 mg/dL (ref 6–23)
CO2: 29 mEq/L (ref 19–32)
Calcium: 10 mg/dL (ref 8.4–10.5)
Chloride: 101 mEq/L (ref 96–112)
Creatinine, Ser: 1 mg/dL (ref 0.40–1.50)
GFR: 75.74 mL/min (ref >60.00)
Glucose, Bld: 110 mg/dL — ABNORMAL HIGH (ref 70–99)
Potassium: 4.3 mEq/L (ref 3.5–5.1)
Sodium: 138 mEq/L (ref 135–145)
Total Bilirubin: 0.5 mg/dL (ref 0.2–1.2)
Total Protein: 6.7 g/dL (ref 6.0–8.3)

## 2019-03-06 LAB — LIPID PANEL
Cholesterol: 152 mg/dL (ref 0–200)
HDL: 29.8 mg/dL — ABNORMAL LOW (ref >39.00)
NonHDL: 121.94
Total CHOL/HDL Ratio: 5
Triglycerides: 290 mg/dL — ABNORMAL HIGH (ref 0.0–149.0)
VLDL: 58 mg/dL — ABNORMAL HIGH (ref 0.0–40.0)

## 2019-03-06 LAB — TSH: TSH: 3.47 u[IU]/mL (ref 0.35–4.50)

## 2019-03-06 LAB — LDL CHOLESTEROL, DIRECT: Direct LDL: 66 mg/dL

## 2019-03-06 LAB — HEMOGLOBIN A1C: Hgb A1c MFr Bld: 6 % (ref 4.6–6.5)

## 2019-03-14 ENCOUNTER — Ambulatory Visit (INDEPENDENT_AMBULATORY_CARE_PROVIDER_SITE_OTHER): Payer: Managed Care, Other (non HMO) | Admitting: Family Medicine

## 2019-03-14 ENCOUNTER — Other Ambulatory Visit: Payer: Self-pay

## 2019-03-14 ENCOUNTER — Encounter: Payer: Self-pay | Admitting: Family Medicine

## 2019-03-14 VITALS — BP 140/88 | HR 84 | Temp 97.1°F | Ht 68.0 in | Wt 202.3 lb

## 2019-03-14 DIAGNOSIS — C4491 Basal cell carcinoma of skin, unspecified: Secondary | ICD-10-CM

## 2019-03-14 DIAGNOSIS — Z0000 Encounter for general adult medical examination without abnormal findings: Secondary | ICD-10-CM

## 2019-03-14 DIAGNOSIS — Z7189 Other specified counseling: Secondary | ICD-10-CM

## 2019-03-14 DIAGNOSIS — G4733 Obstructive sleep apnea (adult) (pediatric): Secondary | ICD-10-CM

## 2019-03-14 DIAGNOSIS — I10 Essential (primary) hypertension: Secondary | ICD-10-CM

## 2019-03-14 DIAGNOSIS — E785 Hyperlipidemia, unspecified: Secondary | ICD-10-CM

## 2019-03-14 DIAGNOSIS — E039 Hypothyroidism, unspecified: Secondary | ICD-10-CM

## 2019-03-14 DIAGNOSIS — R7309 Other abnormal glucose: Secondary | ICD-10-CM

## 2019-03-14 MED ORDER — LEVOTHYROXINE SODIUM 50 MCG PO TABS: 50.0000 ug | ORAL_TABLET | Freq: Every day | ORAL | 3 refills | Status: DC

## 2019-03-14 MED ORDER — TRIAMTERENE-HCTZ 37.5-25 MG PO TABS: 1.0000 | ORAL_TABLET | Freq: Every day | ORAL | 3 refills | Status: DC

## 2019-03-14 MED ORDER — FENOFIBRATE 160 MG PO TABS: 160.0000 mg | ORAL_TABLET | Freq: Every day | ORAL | 3 refills | Status: DC

## 2019-03-14 MED ORDER — ROSUVASTATIN CALCIUM 10 MG PO TABS: 10.0000 mg | ORAL_TABLET | Freq: Every day | ORAL | 3 refills | Status: DC

## 2019-03-14 MED ORDER — PROPRANOLOL HCL 80 MG PO TABS: 80.0000 mg | ORAL_TABLET | Freq: Two times a day (BID) | ORAL | 3 refills | Status: DC

## 2019-03-14 NOTE — Patient Instructions (Signed)
Use the eat right diet.   Update me as needed.  Don't change your meds for now.  Take care.  Glad to see you.

## 2019-03-14 NOTE — Progress Notes (Signed)
CPE- See plan.  Routine anticipatory guidance given to patient.  See health maintenance.  The possibility exists that previously documented standard health maintenance information may have been brought forward from a previous encounter into this note.  If needed, that same information has been updated to reflect the current situation based on today's encounter.    Tetanus 2015 Flu 2020 PNA not due.  Shingles UTD PSA wnl spring 2020 and prev improved from other values.  FH noted.   Colonoscopy 2011 Living will d/w pt. Wife designated if patient were incapacitated.  Diet and exercise d/w pt. HCV and HIV screening prev done. D/w pt.  Diet was off with covid, etc.  D/w pt.   Skin cancer treated per derm clinic.  I'll defer.  He agrees.    Hypertension:  Using medication without problems or lightheadedness: yes Chest pain with exertion:no Edema:no Short of breath:no  OSA on CPAP, compliant.  Used nightly with relief.    Hypothyroidism.  Compliant.  TSH wnl.  No ADE on med. Neck mass.    Elevated Cholesterol: Using medications without problems:yes Muscle aches: no Diet compliance: encouraged.  Exercise: encouraged Labs d/w pt.    B wrist pain.  He assumed he had OA due to prev lifting at work.  CBD cream helps some.  Discussed.  Eat right diet d/w pt, handout given to patient.    PMH and SH reviewed  Meds, vitals, and allergies reviewed.   ROS: Per HPI.  Unless specifically indicated otherwise in HPI, the patient denies:  General: fever. Eyes: acute vision changes ENT: sore throat Cardiovascular: chest pain Respiratory: SOB GI: vomiting GU: dysuria Musculoskeletal: acute back pain Derm: acute rash Neuro: acute motor dysfunction Psych: worsening mood Endocrine: polydipsia Heme: bleeding Allergy: hayfever  GEN: nad, alert and oriented HEENT: ncat NECK: supple w/o LA CV: rrr. PULM: ctab, no inc wob ABD: soft, +bs EXT: no edema SKIN: no acute rash

## 2019-03-18 NOTE — Assessment & Plan Note (Signed)
No change in meds.  Continue work on diet and exercise.  He agrees.  Labs discussed with patient.  Low carbohydrate handout discussed and given to patient.

## 2019-03-18 NOTE — Assessment & Plan Note (Signed)
OSA on CPAP, compliant.  Used nightly with relief.   Continue as is.  He agrees.

## 2019-03-18 NOTE — Assessment & Plan Note (Addendum)
Tetanus 2015 Flu 2020 PNA not due.  Shingles UTD PSA wnl spring 2020 and prev improved from other values.  FH noted.   Colonoscopy 2011 Living will d/w pt. Wife designated if patient were incapacitated.  Diet and exercise d/w pt. HCV and HIV screening prev done. D/w pt.  Diet was off with covid, etc.  D/w pt.

## 2019-03-18 NOTE — Assessment & Plan Note (Signed)
Compliant.  TSH wnl.  No ADE on med. Neck mass.

## 2019-03-18 NOTE — Assessment & Plan Note (Signed)
Living will d/w pt.  Wife designated if patient were incapacitated.   ?

## 2019-03-18 NOTE — Assessment & Plan Note (Signed)
Eat right diet d/w pt, handout given to patient.

## 2019-03-18 NOTE — Assessment & Plan Note (Signed)
Skin cancer treated per derm clinic.  I'll defer.  He agrees.

## 2019-05-30 ENCOUNTER — Telehealth: Payer: Self-pay

## 2019-05-30 NOTE — Telephone Encounter (Signed)
**Note De-Identified Banks Chaikin Obfuscation** I started a Vascepa PA through covermymeds. Key: [id number]

## 2019-07-29 NOTE — Telephone Encounter (Signed)
Pt is calling inquiring about his medication vascepa. I see that there was a PA done for this medication and I asked the pt, but the pt stated that he has not heard anything about it yet. Could you Jeani Hawking, LPN, please advise in this matter? Thank you

## 2019-08-01 NOTE — Telephone Encounter (Addendum)
**Note De-Identified Nishika Parkhurst Obfuscation** The following message was received from covermymeds: Teagun Tivnan (Key: [id number]) Vascepa 1GM capsules [employer] Electronic PA Form Created 2 months ago 2 months ago Determination Favorable 2 months ago Prior authorization for Dalene Seltzer has been approved. Message from plan: Case [id number]  ;Status:Approved;Review Type:Prior Auth; Coverage Start Date:05/31/2019;Coverage End Date:05/30/2020  I called the pt but got no answer so I left a detailed message explaining that his Dalene Seltzer has been approved until 05/30/2020 and that if the cost has gone up it maybe due to a deductible and advised that he should contact his ins company with questions. I left my name and the office phone number so he can call me back if he has questions for me.

## 2019-08-22 ENCOUNTER — Ambulatory Visit (INDEPENDENT_AMBULATORY_CARE_PROVIDER_SITE_OTHER): Payer: Managed Care, Other (non HMO) | Admitting: Podiatry

## 2019-08-22 ENCOUNTER — Ambulatory Visit (INDEPENDENT_AMBULATORY_CARE_PROVIDER_SITE_OTHER): Payer: Managed Care, Other (non HMO)

## 2019-08-22 ENCOUNTER — Other Ambulatory Visit: Payer: Self-pay

## 2019-08-22 DIAGNOSIS — Q828 Other specified congenital malformations of skin: Secondary | ICD-10-CM | POA: Diagnosis not present

## 2019-08-22 DIAGNOSIS — M722 Plantar fascial fibromatosis: Secondary | ICD-10-CM

## 2019-08-22 DIAGNOSIS — L600 Ingrowing nail: Secondary | ICD-10-CM | POA: Diagnosis not present

## 2019-08-22 MED ORDER — NEOMYCIN-POLYMYXIN-HC 1 % OT SOLN: OTIC | 1 refills | Status: AC

## 2019-08-22 NOTE — Progress Notes (Signed)
Subjective:  Patient ID: Adrian Dean, male    DOB: [birth year],  MRN: [id number] HPI Chief Complaint  Patient presents with  . New Patient (Initial Visit)    re-est. last seen in 2017 with Dr. Milinda Pointer   . Foot Pain    bilateral foot pain "hx of PF, right" . states that the left foot has gotten over the years, but now experincing soreness on the lateral side of foot. right foot has sharp pain in heel   . Nail Problem    right hallux, medial . possible ingrown . sharp pain when added pressure     64 y.o. male presents with the above complaint.   ROS: Denies fever chills nausea vomiting muscle aches pains calf pain back pain chest pain shortness of breath.  Past Medical History:  Diagnosis Date  . Calculus of kidney   . Contact dermatitis and other eczema due to detergents   . Esophageal reflux 04/20/99   EGD  . Family history of malignant neoplasm of prostate   . Hiatal hernia   . History of fractured pelvis    fork lift accident- hospital, hip problem  . Macular degeneration    R eye 2015  . Migraine, unspecified, without mention of intractable migraine without mention of status migrainosus   . Other abnormal glucose   . Other and unspecified hyperlipidemia   . Plantar fasciitis    left foot  . Pure hypercholesterolemia   . Unspecified alveolar and parietoalveolar pneumonopathy   . Unspecified essential hypertension   . Unspecified hypothyroidism   . Unspecified sleep apnea    on CPAP   Past Surgical History:  Procedure Laterality Date  . FOOT SURGERY     for plantar fasciitis    Current Outpatient Medications:  .  esomeprazole (NEXIUM) 20 MG capsule, Take 20 mg by mouth daily at 12 noon., Disp: , Rfl:  .  fenofibrate 160 MG tablet, Take 1 tablet (160 mg total) by mouth daily., Disp: 90 tablet, Rfl: 3 .  levothyroxine (SYNTHROID) 50 MCG tablet, Take 1 tablet (50 mcg total) by mouth daily., Disp: 90 tablet, Rfl: 3 .  loratadine (CLARITIN) 10 MG tablet, Take 10 mg  by mouth daily as needed for allergies. , Disp: , Rfl:  .  Multiple Vitamin (MULTIVITAMIN) tablet, Take 1 tablet by mouth daily., Disp: , Rfl:  .  Multiple Vitamins-Minerals (PRESERVISION AREDS 2 PO), Take 1 capsule by mouth 2 (two) times daily. , Disp: , Rfl:  .  NEOMYCIN-POLYMYXIN-HYDROCORTISONE (CORTISPORIN) 1 % SOLN OTIC solution, Apply 1-2 drops to toe BID after soaking, Disp: 10 mL, Rfl: 1 .  propranolol (INDERAL) 80 MG tablet, Take 1 tablet (80 mg total) by mouth 2 (two) times daily., Disp: 180 tablet, Rfl: 3 .  rosuvastatin (CRESTOR) 10 MG tablet, Take 1 tablet (10 mg total) by mouth daily., Disp: 90 tablet, Rfl: 3 .  triamcinolone cream (KENALOG) 0.5 %, Apply 1 application topically 3 (three) times daily as needed., Disp: 30 g, Rfl: 1 .  triamterene-hydrochlorothiazide (MAXZIDE-25) 37.5-25 MG tablet, Take 1 tablet by mouth daily., Disp: 90 tablet, Rfl: 3 .  VASCEPA 1 g CAPS, TAKE 2 CAPSULES (2 G TOTAL) BY MOUTH 2 (TWO) TIMES DAILY., Disp: 120 capsule, Rfl: 11  Allergies  Allergen Reactions  . Codeine Sulfate     REACTION: Headache  . Lipitor [Atorvastatin]     Myalgias   Review of Systems Objective:  There were no vitals filed for this visit.  General: Well developed,  nourished, in no acute distress, alert and oriented x3   Dermatological: Skin is warm, dry and supple bilateral. Nails x 10 are well maintained; remaining integument appears unremarkable at this time. There are no open sores, no preulcerative lesions, no rash or signs of infection present.  Sharp incurvated nail margin tibial border of the hallux right.  Vascular: Dorsalis Pedis artery and Posterior Tibial artery pedal pulses are 2/4 bilateral with immedate capillary fill time. Pedal hair growth present. No varicosities and no lower extremity edema present bilateral.   Neruologic: Grossly intact via light touch bilateral. Vibratory intact via tuning fork bilateral. Protective threshold with Semmes Wienstein  monofilament intact to all pedal sites bilateral. Patellar and Achilles deep tendon reflexes 2+ bilateral. No Babinski or clonus noted bilateral.   Musculoskeletal: No gross boney pedal deformities bilateral. No pain, crepitus, or limitation noted with foot and ankle range of motion bilateral. Muscular strength 5/5 in all groups tested bilateral.  Pain on palpation medial calcaneal tubercle of the right foot.  Gait: Unassisted, Nonantalgic.    Radiographs:  Radiographs taken today demonstrate soft tissue increase in density plantar fascial kidney insertion site of the right heel.  Assessment & Plan:   Assessment: Paronychia ingrown nail hallux right.  Plantar fasciitis right.  Plan: Chemical matricectomy was performed to the hallux right.  Tolerated procedure well without complications.  Also injected the right heel today 20 mg Kenalog 5 mg Marcaine for plantar fasciitis.       Kaytie Ratcliffe T. [place], [place]

## 2019-08-22 NOTE — Patient Instructions (Signed)

## 2019-08-26 ENCOUNTER — Other Ambulatory Visit: Payer: Self-pay | Admitting: Internal Medicine

## 2019-08-28 ENCOUNTER — Ambulatory Visit: Payer: Self-pay | Admitting: Podiatry

## 2019-09-12 ENCOUNTER — Other Ambulatory Visit: Payer: Self-pay

## 2019-09-12 ENCOUNTER — Ambulatory Visit (INDEPENDENT_AMBULATORY_CARE_PROVIDER_SITE_OTHER): Payer: Managed Care, Other (non HMO) | Admitting: Podiatry

## 2019-09-12 ENCOUNTER — Ambulatory Visit: Payer: Managed Care, Other (non HMO) | Admitting: Podiatry

## 2019-09-12 DIAGNOSIS — L600 Ingrowing nail: Secondary | ICD-10-CM

## 2019-09-12 DIAGNOSIS — Z9889 Other specified postprocedural states: Secondary | ICD-10-CM

## 2019-09-12 DIAGNOSIS — M778 Other enthesopathies, not elsewhere classified: Secondary | ICD-10-CM

## 2019-09-12 NOTE — Progress Notes (Signed)
He presents today for follow-up of his matrixectomy hallux right.  States that is doing just great.  No problems whatsoever.  He states that his heel is feeling much better now the only pain he has is in his second metatarsophalangeal joint of his right foot.  Objective: Vital signs are stable he is alert oriented x3 there is no erythema edema cellulitis drainage or odor matrixectomy site.  He has no pain on palpation medial calcaneal tubercle right heel.  He does have pain on end range of motion and on palpation of the second metatarsophalangeal joint of the right foot.  Assessment: Capsulitis which is a new diagnosis second metatarsophalangeal joint of the right foot well-healing matrixectomy hallux right and well-healing plantar fasciitis right.  Plan: Encouraged him to continue to soak the toe every other day Epson salts and warm water cover the daytime while he is at work but leave open at bedtime.  I also provided him with an injection of 10 mg of Kenalog 5 mg Marcaine point of maximal tenderness to plantar medial aspect of the second metatarsophalangeal joint.  Tolerated procedure well without complications I will follow-up with him on an as-needed basis.

## 2019-09-26 ENCOUNTER — Ambulatory Visit (INDEPENDENT_AMBULATORY_CARE_PROVIDER_SITE_OTHER): Payer: Managed Care, Other (non HMO) | Admitting: Internal Medicine

## 2019-09-26 ENCOUNTER — Encounter: Payer: Self-pay | Admitting: Internal Medicine

## 2019-09-26 ENCOUNTER — Other Ambulatory Visit: Payer: Self-pay

## 2019-09-26 VITALS — BP 144/82 | HR 62 | Ht 68.0 in | Wt 199.2 lb

## 2019-09-26 DIAGNOSIS — E785 Hyperlipidemia, unspecified: Secondary | ICD-10-CM | POA: Diagnosis not present

## 2019-09-26 DIAGNOSIS — I10 Essential (primary) hypertension: Secondary | ICD-10-CM

## 2019-09-26 LAB — CBC
Hematocrit: 41.8 % (ref 37.5–51.0)
Hemoglobin: 14.1 g/dL (ref 13.0–17.7)
MCH: 29.7 pg (ref 26.6–33.0)
MCHC: 33.7 g/dL (ref 31.5–35.7)
MCV: 88 fL (ref 79–97)
Platelets: 241 10*3/uL (ref 150–450)
RBC: 4.74 x10E6/uL (ref 4.14–5.80)
RDW: 12.9 % (ref 11.6–15.4)
WBC: 9.1 10*3/uL (ref 3.4–10.8)

## 2019-09-26 LAB — LIPID PANEL
Chol/HDL Ratio: 4.7 ratio (ref 0.0–5.0)
Cholesterol, Total: 164 mg/dL (ref 100–199)
HDL: 35 mg/dL — ABNORMAL LOW (ref >39)
LDL Chol Calc (NIH): 91 mg/dL (ref 0–99)
Triglycerides: 224 mg/dL — ABNORMAL HIGH (ref 0–149)
VLDL Cholesterol Cal: 38 mg/dL (ref 5–40)

## 2019-09-26 LAB — BASIC METABOLIC PANEL
BUN/Creatinine Ratio: 17 (ref 10–24)
BUN: 19 mg/dL (ref 8–27)
CO2: 24 mmol/L (ref 20–29)
Calcium: 10.3 mg/dL — ABNORMAL HIGH (ref 8.6–10.2)
Chloride: 98 mmol/L (ref 96–106)
Creatinine, Ser: 1.1 mg/dL (ref 0.76–1.27)
GFR calc Af Amer: 83 mL/min/{1.73_m2} (ref >59)
GFR calc non Af Amer: 72 mL/min/{1.73_m2} (ref >59)
Glucose: 112 mg/dL — ABNORMAL HIGH (ref 65–99)
Potassium: 4.2 mmol/L (ref 3.5–5.2)
Sodium: 138 mmol/L (ref 134–144)

## 2019-09-26 NOTE — Progress Notes (Signed)
Cardiology Office Note   Date:  09/26/2019   ID:  Adrian Dean, DOB 01-28-1956, MRN [id number]  PCP:  Tonia Ghent, MD  Cardiologist:   Dorris Carnes, MD    Pt presents for f/u of HTN   Hx of CP    History of Present Illness: Adrian Dean is a 64 y.o. male with a history of chest pressure   L side  He was seen by S Copland on 08/28/17   He also has a hx of HTN and OSA (uses CPAP, likes It)  Hx of smoking   QUit in past   Since he was last in clinic he has done well   Just came in from work He denies CP   Breathing is OK   No dizziness Works on his feet all day    Current Meds  Medication Sig  . esomeprazole (NEXIUM) 20 MG capsule Take 20 mg by mouth daily at 12 noon.  . fenofibrate 160 MG tablet Take 1 tablet (160 mg total) by mouth daily.  Marland Kitchen icosapent Ethyl (VASCEPA) 1 g capsule Take 2 capsules (2 g total) by mouth 2 (two) times daily. Please make overdue appt with Dr. Harrington Challenger before anymore refills. 2nd attempt  . levothyroxine (SYNTHROID) 50 MCG tablet Take 1 tablet (50 mcg total) by mouth daily.  Marland Kitchen loratadine (CLARITIN) 10 MG tablet Take 10 mg by mouth daily as needed for allergies.   . Multiple Vitamin (MULTIVITAMIN) tablet Take 1 tablet by mouth daily.  . Multiple Vitamins-Minerals (PRESERVISION AREDS 2 PO) Take 1 capsule by mouth 2 (two) times daily.   . NEOMYCIN-POLYMYXIN-HYDROCORTISONE (CORTISPORIN) 1 % SOLN OTIC solution Apply 1-2 drops to toe BID after soaking  . propranolol (INDERAL) 80 MG tablet Take 1 tablet (80 mg total) by mouth 2 (two) times daily.  . rosuvastatin (CRESTOR) 10 MG tablet Take 1 tablet (10 mg total) by mouth daily.  Marland Kitchen triamcinolone cream (KENALOG) 0.5 % Apply 1 application topically 3 (three) times daily as needed.  . triamterene-hydrochlorothiazide (MAXZIDE-25) 37.5-25 MG tablet Take 1 tablet by mouth daily.     Allergies:   Codeine sulfate and Lipitor [atorvastatin]   Past Medical History:  Diagnosis Date  . Calculus of kidney   .  Contact dermatitis and other eczema due to detergents   . Esophageal reflux 04/20/99   EGD  . Family history of malignant neoplasm of prostate   . Hiatal hernia   . History of fractured pelvis    fork lift accident- hospital, hip problem  . Macular degeneration    R eye 2015  . Migraine, unspecified, without mention of intractable migraine without mention of status migrainosus   . Other abnormal glucose   . Other and unspecified hyperlipidemia   . Plantar fasciitis    left foot  . Pure hypercholesterolemia   . Unspecified alveolar and parietoalveolar pneumonopathy   . Unspecified essential hypertension   . Unspecified hypothyroidism   . Unspecified sleep apnea    on CPAP    Past Surgical History:  Procedure Laterality Date  . FOOT SURGERY     for plantar fasciitis     Social History:  The patient  reports that he has quit smoking. He has never used smokeless tobacco. He reports that he does not drink alcohol or use drugs.   Family History:  The patient's family history includes Cancer in his brother and father; Cirrhosis in his mother; Heart disease in his father; Hypertension in his mother;  Obesity in his mother; Prostate cancer in his father.    ROS:  Please see the history of present illness. All other systems are reviewed and  Negative to the above problem except as noted.    PHYSICAL EXAM: VS:  BP (!) 144/82   Pulse 62   Ht 5\' 8"  (1.727 m)   Wt 199 lb 3.2 oz (90.4 kg)   SpO2 96%   BMI 30.29 kg/m   GEN: Overweight 64 yo , in no acute distress  HEENT: normal  Neck: no JVD   No carotid bruits, Cardiac: RRR; no murmurs, rubs, or gallops,no LE edema  Chest   Nontender  Respiratory:  clear to auscultation bilaterally, normal work of breathing GI: Soft;  nontender  Positive  BS  No hepatomegaly  MS: no deformity Moving all extremities   Skin: warm and dry, no rash Neuro:  Strength and sensation are intact Psych: euthymic mood, full affect   EKG:  EKG shows SR 62  bpm     Lipid Panel    Component Value Date/Time   CHOL 152 03/06/2019 0759   CHOL 140 03/21/2018 0738   TRIG 290.0 (H) 03/06/2019 0759   HDL 29.80 (L) 03/06/2019 0759   HDL 34 (L) 03/21/2018 0738   CHOLHDL 5 03/06/2019 0759   VLDL 58.0 (H) 03/06/2019 0759   LDLCALC 79 03/21/2018 0738   LDLDIRECT 66.0 03/06/2019 0759      Wt Readings from Last 3 Encounters:  09/26/19 199 lb 3.2 oz (90.4 kg)  03/14/19 202 lb 5 oz (91.8 kg)  06/19/18 196 lb (88.9 kg)      ASSESSMENT AND PLAN:  1  HTN  Pt's BP is high today   On my check it is about 155/85    It was high as well when seen by Orlinda Blalock   Will get labs today  But  Plan on modifiying regimen    F/U in HTN clinic in 5 to 60 wks   Bring BP cuff and log at that time    2  Dyslipidemia  Check lpids today     3   Sleep   Doing good with CPAP   4 CP   Denies        Current medicines are reviewed at length with the patient today.  The patient does not have concerns regarding medicines.  Signed, Dorris Carnes, MD  09/26/2019 8:12 AM    [address], [place], [place]  [address] Phone: [phone]; Fax: [phone]

## 2019-09-26 NOTE — Patient Instructions (Signed)
Medication Instructions:  NO CHANGES *If you need a refill on your cardiac medications before your next appointment, please call your pharmacy*   Lab Work: TODAY CBC BMET AND LIPID If you have labs (blood work) drawn today and your tests are completely normal, you will receive your results only by: Marland Kitchen MyChart Message (if you have MyChart) OR . A paper copy in the mail If you have any lab test that is abnormal or we need to change your treatment, we will call you to review the results.   Testing/Procedures: NONE   Follow-Up: At [facility], you and your health needs are our priority.  As part of our continuing mission to provide you with exceptional heart care, we have created designated Provider Care Teams.  These Care Teams include your primary Cardiologist (physician) and Advanced Practice Providers (APPs -  Physician Assistants and Nurse Practitioners) who all work together to provide you with the care you need, when you need it.  We recommend signing up for the patient portal called "MyChart".  Sign up information is provided on this After Visit Summary.  MyChart is used to connect with patients for Virtual Visits (Telemedicine).  Patients are able to view lab/test results, encounter notes, upcoming appointments, etc.  Non-urgent messages can be sent to your provider as well.   To learn more about what you can do with MyChart, go to [email or web address].    Your next appointment:   5 week(s)  The format for your next appointment:   In Person  Provider:   [facility]   Other Instructions NONE

## 2019-09-27 ENCOUNTER — Telehealth: Payer: Self-pay | Admitting: *Deleted

## 2019-09-27 DIAGNOSIS — E785 Hyperlipidemia, unspecified: Secondary | ICD-10-CM

## 2019-09-27 MED ORDER — ROSUVASTATIN CALCIUM 20 MG PO TABS
20.0000 mg | ORAL_TABLET | Freq: Every day | ORAL | 3 refills | Status: DC
Start: 2019-09-27 — End: 2020-04-07

## 2019-09-27 MED ORDER — AMLODIPINE BESYLATE 2.5 MG PO TABS
2.5000 mg | ORAL_TABLET | Freq: Every day | ORAL | 3 refills | Status: DC
Start: 2019-09-27 — End: 2019-10-31

## 2019-09-27 NOTE — Telephone Encounter (Signed)
Spoke with patient and reviewed results. He verbalizes understanding and is in agreement with plan. Will return July for fasting lipids to recheck LDL and triglycerides.

## 2019-09-27 NOTE — Telephone Encounter (Signed)
-----   Message from Fay Records, MD sent at 09/27/2019 11:41 AM EDT ----- Electrolytes and kidney function are OK  CBC is normal  Lipids   LDL is 91  HDL 35   Trigs are high at 224   Watch carbs Keep on Vascepa.  Increase Crestor to 20   He has scattered coronary calcifications of chest CT   F/U lipids in 10 wks   BP was up in clinic    I would add 2.5 mg amlodipine to regimen   Pt should be set up for HTN clinic in 4 to 6 wks (made appt yesterday)

## 2019-10-12 ENCOUNTER — Other Ambulatory Visit: Payer: Self-pay | Admitting: Internal Medicine

## 2019-10-31 ENCOUNTER — Ambulatory Visit (INDEPENDENT_AMBULATORY_CARE_PROVIDER_SITE_OTHER): Payer: Managed Care, Other (non HMO) | Admitting: Pharmacist

## 2019-10-31 ENCOUNTER — Other Ambulatory Visit: Payer: Self-pay

## 2019-10-31 VITALS — BP 156/98

## 2019-10-31 DIAGNOSIS — I10 Essential (primary) hypertension: Secondary | ICD-10-CM

## 2019-10-31 MED ORDER — AMLODIPINE BESYLATE 5 MG PO TABS: 5.0000 mg | ORAL_TABLET | Freq: Every day | ORAL | 0 refills | Status: DC

## 2019-10-31 NOTE — Patient Instructions (Signed)
It was nice meeting you today!  Your blood pressure goal is less than 130/80  Continue your triamterene/hctz and propranolol every day  We are going to increase your amlodipine to 5 mg once a day  Try to cut down on your fast food and start to bring more lunches from home  Karren Cobble, PharmD, [employer], [place] [address] [place], [place] [address] Phone: [phone]; Fax: [phone] 10/31/2019 3:55 PM

## 2019-10-31 NOTE — Progress Notes (Signed)
Patient ID: Adrian Dean                 DOB: 1956/05/15                      MRN: [id number]     HPI: Adrian Dean is a 64 y.o. male referred by Dr. Harrington Challenger to HTN clinic. PMH is significant for HTN, hypothyroidism, and HLD. This is his first visit to the HTN clinic.  Currently managed on triamterene/hctz 37.5/25 once daily, amlodipine 2.5 mg daily, and propranolol 80 mg BID.  Uses propranolol for migraine prophylaxis and reports it is effective.  Reports no adverse effects from BP medications.  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  Physically active at work, constantly walking, climbing, and moving steel.  Home life is also physically active.  Has a 25 year old son with muscular dystrophy and patient needs to carry him around the house, into his wheelchair, and into bed.  Previously had to wake up every 30 minutes and roll his son in the bed but now has a night nurse 6 days a week.  Reports his hand is very sore and his wife is having shoulder surgery later this month so he will be doing most of the work around the house.  Goes to bed around 11:30 at night, wakes up at 3:30 in the morning. Takes BP in the evening.  Wife does the cooking, for lunch he eats fast food.  Does not add additional salt.  Home BP readings 6/1: 121/80 6/2:  138/93 6/3: 137/92 6/4: 136/88 6/5: 135/88 6/6: 134/93 6/7: 127/81 6/8: 118/79 6/9: 142/87 6/10: 142/87 6/11: 126/79 6/12: 133/91 6/13: 124/80 6/14: 121/80 6/15: 131/82  Patient reports he has always had significant white coat hypertension.  Current HTN meds: amlodipine 2.5mg  once a day, propranolol 80 mg twice a day (headaches). Maxzide 25 once a day  Previously tried: n/a  BP goal: <130/80  Social History: Does not drink alcohol.  Quit smoking >30 years ago  Diet:  Wife prepares meals at home.  Eats out for lunch, frequently fast food.  Drinks 1 cup of coffee in morning, drinks water and caffeine free diet mountain dew during day  Exercise: Is  very active at work but reports he is very tired when he gets home    Home BP readings:   Wt Readings from Last 3 Encounters:  09/26/19 199 lb 3.2 oz (90.4 kg)  03/14/19 202 lb 5 oz (91.8 kg)  06/19/18 196 lb (88.9 kg)   BP Readings from Last 3 Encounters:  09/26/19 (!) 144/82  03/14/19 140/88  06/19/18 122/84   Pulse Readings from Last 3 Encounters:  09/26/19 62  03/14/19 84  06/19/18 67    Renal function: CrCl cannot be calculated (Patient's most recent lab result is older than the maximum 21 days allowed.).  Past Medical History:  Diagnosis Date  . Calculus of kidney   . Contact dermatitis and other eczema due to detergents   . Esophageal reflux 04/20/99   EGD  . Family history of malignant neoplasm of prostate   . Hiatal hernia   . History of fractured pelvis    fork lift accident- hospital, hip problem  . Macular degeneration    R eye 2015  . Migraine, unspecified, without mention of intractable migraine without mention of status migrainosus   . Other abnormal glucose   . Other and unspecified hyperlipidemia   . Plantar fasciitis  left foot  . Pure hypercholesterolemia   . Unspecified alveolar and parietoalveolar pneumonopathy   . Unspecified essential hypertension   . Unspecified hypothyroidism   . Unspecified sleep apnea    on CPAP    Current Outpatient Medications on File Prior to Visit  Medication Sig Dispense Refill  . amLODipine (NORVASC) 2.5 MG tablet Take 1 tablet (2.5 mg total) by mouth daily. 90 tablet 3  . esomeprazole (NEXIUM) 20 MG capsule Take 20 mg by mouth daily at 12 noon.    . fenofibrate 160 MG tablet Take 1 tablet (160 mg total) by mouth daily. 90 tablet 3  . icosapent Ethyl (VASCEPA) 1 g capsule Take 2 capsules (2 g total) by mouth 2 (two) times daily. 360 capsule 3  . levothyroxine (SYNTHROID) 50 MCG tablet Take 1 tablet (50 mcg total) by mouth daily. 90 tablet 3  . loratadine (CLARITIN) 10 MG tablet Take 10 mg by mouth daily as  needed for allergies.     . Multiple Vitamin (MULTIVITAMIN) tablet Take 1 tablet by mouth daily.    . Multiple Vitamins-Minerals (PRESERVISION AREDS 2 PO) Take 1 capsule by mouth 2 (two) times daily.     . NEOMYCIN-POLYMYXIN-HYDROCORTISONE (CORTISPORIN) 1 % SOLN OTIC solution Apply 1-2 drops to toe BID after soaking 10 mL 1  . propranolol (INDERAL) 80 MG tablet Take 1 tablet (80 mg total) by mouth 2 (two) times daily. 180 tablet 3  . rosuvastatin (CRESTOR) 20 MG tablet Take 1 tablet (20 mg total) by mouth daily. 90 tablet 3  . triamcinolone cream (KENALOG) 0.5 % Apply 1 application topically 3 (three) times daily as needed. 30 g 1  . triamterene-hydrochlorothiazide (MAXZIDE-25) 37.5-25 MG tablet Take 1 tablet by mouth daily. 90 tablet 3   No current facility-administered medications on file prior to visit.    Allergies  Allergen Reactions  . Codeine Sulfate     REACTION: Headache  . Lipitor [Atorvastatin]     Myalgias     Assessment/Plan:  1. Hypertension - BP in office today 156/98 which is above goal of <130/80.  Patient's home BP cuff reading in room: 161/102.  However, patient's home BP readings are occasionally at goal or close.  Counseled patient to try to reduce fast food consumption as meals are high in salt and fats.  Since patient HTN is nearing goal, would benefit from increase amlodipine to 5 mg once daily.  Recommended patient follow up with PCP for hand pain since he is going to have to be more physically active at home soon and stress may exacerbate his HTN.  Will follow up with patient in 4 weeks with BP logs.  Patient to call with any questions/concerns.  Karren Cobble, PharmD, BCACP, [place] [address] [place], [place] [address] Phone: [phone]; Fax: [phone] 10/31/2019 4:49 PM

## 2019-11-28 ENCOUNTER — Ambulatory Visit (INDEPENDENT_AMBULATORY_CARE_PROVIDER_SITE_OTHER): Payer: Managed Care, Other (non HMO) | Admitting: Pharmacist

## 2019-11-28 ENCOUNTER — Other Ambulatory Visit: Payer: Self-pay

## 2019-11-28 ENCOUNTER — Encounter: Payer: Self-pay | Admitting: Pharmacist

## 2019-11-28 VITALS — BP 133/88 | HR 69

## 2019-11-28 DIAGNOSIS — I10 Essential (primary) hypertension: Secondary | ICD-10-CM

## 2019-11-28 NOTE — Patient Instructions (Addendum)
It was good seeing you again!  Your blood pressure has improved since last visit but still a little above where we would like it  I want you to increase your amlodipine to 7.5 mg daily (1 and 1/2 tablets of the 5mg )  Keep watching your diet and bringing lunch from home  Recheck back in clinic 1 month  Karren Cobble, PharmD, BCACP, [place] [address], [place], [place] [address] Phone: [phone]; Fax: [phone] 11/28/2019 3:36 PM

## 2019-11-28 NOTE — Progress Notes (Signed)
Patient ID: GALILEO COLELLO                 DOB: Aug 30, 1955                      MRN: [id number]     HPI: Adrian Dean is a 64 y.o. male referred by Dr. Harrington Challenger to HTN clinic. PMH is significant for HTN, hypothyroidism, and HLD. Currently managed on triamterene/hctz 37.5/25 once daily, amlodipine 5 mg daily, and propranolol 80 mg BID.  Uses propranolol for migraine prophylaxis and reports it is effective.  Reports no adverse effects from BP medications.   At last HTN visit, amlodipine was increased to 5 mg.  Pt continues to work long hours and sleeps about 4 hours per night.  Wife had shoulder surgery and he was expecting her to be unable to help around the house or with their son but she is recovering quickly.  Is tolerating increase in amlodipine well, reports no adverse effects.    Brought BP log: 6/21: 133/80 pulse 72 6/24: 124/77 pulse 67 6/25: 135/83 pulse 72 6/27: 128/83 pulse 70 7/3: 124/80 pulse 65 7/10: 130/83 pulse 73 7/12: 134/87 pulse 77 7/13: 129/88 pulse 67  Since last visit has tried to work on eating healthier.  Is bringing more food from home and eating more salads.  Is not going out to eat as much anymore for lunch.  Current HTN meds: amlodipine 5 mg, triamterene/hctz 37.5/25, propranalol 80 mg BID Previously tried: amlodipine 2.5 BP goal: <130/80   Wt Readings from Last 3 Encounters:  09/26/19 199 lb 3.2 oz (90.4 kg)  03/14/19 202 lb 5 oz (91.8 kg)  06/19/18 196 lb (88.9 kg)   BP Readings from Last 3 Encounters:  10/31/19 (!) 156/98  09/26/19 (!) 144/82  03/14/19 140/88   Pulse Readings from Last 3 Encounters:  09/26/19 62  03/14/19 84  06/19/18 67    Renal function: CrCl cannot be calculated (Patient's most recent lab result is older than the maximum 21 days allowed.).  Past Medical History:  Diagnosis Date  . Calculus of kidney   . Contact dermatitis and other eczema due to detergents   . Esophageal reflux 04/20/99   EGD  . Family history of  malignant neoplasm of prostate   . Hiatal hernia   . History of fractured pelvis    fork lift accident- hospital, hip problem  . Macular degeneration    R eye 2015  . Migraine, unspecified, without mention of intractable migraine without mention of status migrainosus   . Other abnormal glucose   . Other and unspecified hyperlipidemia   . Plantar fasciitis    left foot  . Pure hypercholesterolemia   . Unspecified alveolar and parietoalveolar pneumonopathy   . Unspecified essential hypertension   . Unspecified hypothyroidism   . Unspecified sleep apnea    on CPAP    Current Outpatient Medications on File Prior to Visit  Medication Sig Dispense Refill  . amLODipine (NORVASC) 5 MG tablet Take 1 tablet (5 mg total) by mouth daily. 90 tablet 0  . esomeprazole (NEXIUM) 20 MG capsule Take 20 mg by mouth daily at 12 noon.    . fenofibrate 160 MG tablet Take 1 tablet (160 mg total) by mouth daily. 90 tablet 3  . icosapent Ethyl (VASCEPA) 1 g capsule Take 2 capsules (2 g total) by mouth 2 (two) times daily. 360 capsule 3  . levothyroxine (SYNTHROID) 50 MCG tablet Take 1  tablet (50 mcg total) by mouth daily. 90 tablet 3  . loratadine (CLARITIN) 10 MG tablet Take 10 mg by mouth daily as needed for allergies.     . Multiple Vitamin (MULTIVITAMIN) tablet Take 1 tablet by mouth daily.    . Multiple Vitamins-Minerals (PRESERVISION AREDS 2 PO) Take 1 capsule by mouth 2 (two) times daily.     . NEOMYCIN-POLYMYXIN-HYDROCORTISONE (CORTISPORIN) 1 % SOLN OTIC solution Apply 1-2 drops to toe BID after soaking 10 mL 1  . propranolol (INDERAL) 80 MG tablet Take 1 tablet (80 mg total) by mouth 2 (two) times daily. 180 tablet 3  . rosuvastatin (CRESTOR) 20 MG tablet Take 1 tablet (20 mg total) by mouth daily. 90 tablet 3  . triamcinolone cream (KENALOG) 0.5 % Apply 1 application topically 3 (three) times daily as needed. 30 g 1  . triamterene-hydrochlorothiazide (MAXZIDE-25) 37.5-25 MG tablet Take 1 tablet by  mouth daily. 90 tablet 3   No current facility-administered medications on file prior to visit.    Allergies  Allergen Reactions  . Codeine Sulfate     REACTION: Headache  . Lipitor [Atorvastatin]     Myalgias     Assessment/Plan:  1. Hypertension - BP today 133/88 which is above goal of <130/80 but improved since last visit likely due to increase in amlodipine and improving diet.  Since patient tolerating amlodipine, will increase to 7.5 mg.  Patient reports he has enough tablets at home to make 7.5 mg.  Recommended patient continue to work on positive diet change and recheck in 4 weeks.  Karren Cobble, PharmD, BCACP, [place] [address], [place], [place] [address] Phone: [phone]; Fax: [phone] 11/28/2019 3:50 PM

## 2019-12-06 ENCOUNTER — Other Ambulatory Visit: Payer: Managed Care, Other (non HMO) | Admitting: *Deleted

## 2019-12-06 ENCOUNTER — Other Ambulatory Visit: Payer: Self-pay

## 2019-12-06 DIAGNOSIS — E785 Hyperlipidemia, unspecified: Secondary | ICD-10-CM

## 2019-12-06 LAB — LIPID PANEL
Chol/HDL Ratio: 4.3 ratio (ref 0.0–5.0)
Cholesterol, Total: 126 mg/dL (ref 100–199)
HDL: 29 mg/dL — ABNORMAL LOW (ref >39)
LDL Chol Calc (NIH): 64 mg/dL (ref 0–99)
Triglycerides: 201 mg/dL — ABNORMAL HIGH (ref 0–149)
VLDL Cholesterol Cal: 33 mg/dL (ref 5–40)

## 2019-12-26 ENCOUNTER — Ambulatory Visit (INDEPENDENT_AMBULATORY_CARE_PROVIDER_SITE_OTHER): Payer: Managed Care, Other (non HMO) | Admitting: Pharmacist

## 2019-12-26 ENCOUNTER — Other Ambulatory Visit: Payer: Self-pay

## 2019-12-26 VITALS — BP 130/82 | HR 65

## 2019-12-26 DIAGNOSIS — I10 Essential (primary) hypertension: Secondary | ICD-10-CM | POA: Diagnosis not present

## 2019-12-26 MED ORDER — AMLODIPINE BESYLATE 10 MG PO TABS: 10.0000 mg | ORAL_TABLET | Freq: Every day | ORAL | 3 refills | Status: DC

## 2019-12-26 NOTE — Progress Notes (Signed)
Patient ID: Adrian Dean                 DOB: 05-Mar-1956                      MRN: [id number]     HPI: Adrian Dean is a 64 y.o. male referred by Dr. Harrington Challenger to HTN clinic. PMH is significant for HTN, hypothyroidism, and HLD. Currently managed on triamterene/hctz 37.5/25 once daily, amlodipine 7.5 mg daily, and propranolol 80 mg BID. Uses propranolol for migraine prophylaxis and reports it is effective. Reports no adverse effects from BP medications.   This is his third visit to HTN clinic and amlodipine has been slowly titrated up.  At last visit, amlodipine was increased to 7.5 mg and patient had been working on diet changes.  Patient presents today in good spirits.  Has appointment with opthalmology tomorrow due to macular degeneration and reports arthritis in hands.  Has had no adverse effects to increase in amlodipine last month.  Has reduced his propranolol to 80 mg in the morning and reports having more energy and no incidence of migraines.  Pulse has remained steady and patient has been checking BP at home daily.  Reports he feels better.  8/11: 119/80, 65 8/10: 124/86, 67 8/9: 110/80, 73 8/6: 129/87, 81 8/3: 121/80, 76 8/2: 121/84, 78 8/1: 128/83, 71  Current HTN meds: amlodipine 7.5 mg, triamterene/hctz 37.5/25, propranalol 80 mg BID Previously tried: amlodipine 2.5mg , 5 mg BP goal: <130/80   Wt Readings from Last 3 Encounters:  09/26/19 199 lb 3.2 oz (90.4 kg)  03/14/19 202 lb 5 oz (91.8 kg)  06/19/18 196 lb (88.9 kg)   BP Readings from Last 3 Encounters:  11/28/19 133/88  10/31/19 (!) 156/98  09/26/19 (!) 144/82   Pulse Readings from Last 3 Encounters:  11/28/19 69  09/26/19 62  03/14/19 84    Renal function: CrCl cannot be calculated (Patient's most recent lab result is older than the maximum 21 days allowed.).  Past Medical History:  Diagnosis Date  . Calculus of kidney   . Contact dermatitis and other eczema due to detergents   . Esophageal reflux  04/20/99   EGD  . Family history of malignant neoplasm of prostate   . Hiatal hernia   . History of fractured pelvis    fork lift accident- hospital, hip problem  . Macular degeneration    R eye 2015  . Migraine, unspecified, without mention of intractable migraine without mention of status migrainosus   . Other abnormal glucose   . Other and unspecified hyperlipidemia   . Plantar fasciitis    left foot  . Pure hypercholesterolemia   . Unspecified alveolar and parietoalveolar pneumonopathy   . Unspecified essential hypertension   . Unspecified hypothyroidism   . Unspecified sleep apnea    on CPAP    Current Outpatient Medications on File Prior to Visit  Medication Sig Dispense Refill  . amLODipine (NORVASC) 5 MG tablet Take 1 tablet (5 mg total) by mouth daily. 90 tablet 0  . esomeprazole (NEXIUM) 20 MG capsule Take 20 mg by mouth daily at 12 noon.    . fenofibrate 160 MG tablet Take 1 tablet (160 mg total) by mouth daily. 90 tablet 3  . icosapent Ethyl (VASCEPA) 1 g capsule Take 2 capsules (2 g total) by mouth 2 (two) times daily. 360 capsule 3  . levothyroxine (SYNTHROID) 50 MCG tablet Take 1 tablet (50 mcg total) by  mouth daily. 90 tablet 3  . loratadine (CLARITIN) 10 MG tablet Take 10 mg by mouth daily as needed for allergies.     . Multiple Vitamin (MULTIVITAMIN) tablet Take 1 tablet by mouth daily.    . Multiple Vitamins-Minerals (PRESERVISION AREDS 2 PO) Take 1 capsule by mouth 2 (two) times daily.     . NEOMYCIN-POLYMYXIN-HYDROCORTISONE (CORTISPORIN) 1 % SOLN OTIC solution Apply 1-2 drops to toe BID after soaking 10 mL 1  . propranolol (INDERAL) 80 MG tablet Take 1 tablet (80 mg total) by mouth 2 (two) times daily. 180 tablet 3  . rosuvastatin (CRESTOR) 20 MG tablet Take 1 tablet (20 mg total) by mouth daily. 90 tablet 3  . triamcinolone cream (KENALOG) 0.5 % Apply 1 application topically 3 (three) times daily as needed. 30 g 1  . triamterene-hydrochlorothiazide (MAXZIDE-25)  37.5-25 MG tablet Take 1 tablet by mouth daily. 90 tablet 3   No current facility-administered medications on file prior to visit.    Allergies  Allergen Reactions  . Codeine Sulfate     REACTION: Headache  . Lipitor [Atorvastatin]     Myalgias     Assessment/Plan:  1. Hypertension - BP in room today 130/82 which is slightly above goal of <130/80 which is much improved. All systolic values on home readings are at goal, however diastolic still slightly above goal.  Since patient has been tolerating amlodipine will increase to 10 mg daily and instructed patient to continue checking BP daily.  Advised patient to call PCP regarding arthritis pain in hands  Advised patient to call in 2-3 weeks with BP readings or with any adverse effects from amlodipine.  Recheck as needed  Karren Cobble, PharmD, BCACP, [facility] [address], [place], [place] [address] Phone: [phone]; Fax: [phone] 12/26/2019 11:26 AM

## 2019-12-26 NOTE — Patient Instructions (Signed)
It was good seeing you again!  Your blood pressure today was almost at goal of less than 130/80  We are going to increase your to amlodipine 10 mg once daily  Continue checking your blood pressure daily  Please call us with any questions  Karren Cobble, PharmD, [place], [place] [address], [place], [place] [address] Phone: [phone]; Fax: [phone] 12/26/2019 10:53 AM

## 2019-12-30 ENCOUNTER — Other Ambulatory Visit: Payer: Self-pay

## 2019-12-30 ENCOUNTER — Ambulatory Visit (INDEPENDENT_AMBULATORY_CARE_PROVIDER_SITE_OTHER): Payer: Managed Care, Other (non HMO) | Admitting: Family Medicine

## 2019-12-30 ENCOUNTER — Encounter: Payer: Self-pay | Admitting: Family Medicine

## 2019-12-30 VITALS — BP 132/80 | HR 82 | Temp 97.6°F | Ht 68.0 in | Wt 202.0 lb

## 2019-12-30 DIAGNOSIS — M19039 Primary osteoarthritis, unspecified wrist: Secondary | ICD-10-CM

## 2019-12-30 DIAGNOSIS — M19049 Primary osteoarthritis, unspecified hand: Secondary | ICD-10-CM | POA: Diagnosis not present

## 2019-12-30 MED ORDER — MELOXICAM 15 MG PO TABS
15.0000 mg | ORAL_TABLET | Freq: Every day | ORAL | 2 refills | Status: DC
Start: 2019-12-30 — End: 2020-03-20

## 2019-12-30 MED ORDER — PREDNISONE 20 MG PO TABS
ORAL_TABLET | ORAL | 0 refills | Status: DC
Start: 2019-12-30 — End: 2020-03-20

## 2019-12-30 NOTE — Progress Notes (Signed)
Adrian Galer T. Azka Steger, MD, [facility] at [facility] [place], [address]  Phone: [phone]   FAX: [phone]  Adrian Dean - 64 y.o. male   MRN [id number]   Date of Birth: 04/09/1956  Date: 12/30/2019   PCP: Tonia Ghent, MD   Referral: Tonia Ghent, MD  Chief Complaint  Patient presents with   Wrist Pain    Right    This visit occurred during the SARS-CoV-2 public health emergency.  Safety protocols were in place, including screening questions prior to the visit, additional usage of staff PPE, and extensive cleaning of exam room while observing appropriate contact time as indicated for disinfecting solutions.   Subjective:   Adrian Dean is a 64 y.o. very pleasant male patient with Body mass index is 30.71 kg/m. who presents with the following:  He is a nice gentleman he has been a Building control surveyor for 42 years.  He has swelling in his right wrist as well as swelling in his MCP joints.  He also has some prominent Heberden's nodes at the DIP joint.  He continues to have excellent grip, but he has pain predominantly in the right wrist joint.  There is no significant pain at the elbow.  R wrist pain.  Mostly in the true wris.   All over the hand and wrist on the right  RHD  Review of Systems is noted in the HPI, as appropriate   Objective:   BP 132/80    Pulse 82    Temp 97.6 F (36.4 C) (Temporal)    Ht 5\' 8"  (1.727 m)    Wt 202 lb (91.6 kg)    SpO2 96%    BMI 30.71 kg/m    GEN: WDWN, NAD, Non-toxic, Alert & Oriented x 3 HEENT: Atraumatic, Normocephalic.  Ears and Nose: No external deformity. EXTR: No clubbing/cyanosis/edema NEURO: Normal gait.  PSYCH: Normally interactive. Conversant. Not depressed or anxious appearing.  Calm demeanor.   Hand: Right ROM wrist/hand/digits/elbow: full  Carpals, MCP's, digits: Swelling at the MCP joints relatively diffusely Distal  Ulna and Radius: NT Supination lift test: neg Ecchymosis or edema: Effusion at the wrist Cysts/nodules: neg Finkelstein's test: neg Snuffbox tenderness: neg Scaphoid tubercle: NT Hook of Hamate: NT Resisted supination: NT Full composite fist Grip, all digits: 5/5 str Axial load test: Causes pain Phalen's: neg Tinel's: neg Atrophy: neg  Hand sensation: intact   Radiology: No results found.  Assessment and Plan:     ICD-10-CM   1. 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  There is a challenging case, because the patient is already got some longstanding issues with an acute flareup in his wrist greater than hand.  This is going to be an issue off and on for the remainder of his life.  This would improve if he retired and put less strain on his hand and wrist as a Building control surveyor, but that is not a realistic option.  Pulse him with steroids right now and then use some oral meloxicam daily as needed with some flareups.  Patient Instructions  OSTEOARTHRITIS: Over the counter  Try the steroids and Meloxicam first: Take the 10 days of steroids, then switch to Meloxicam daily  You can use your wrist splint to rest the wrist at night time sometimes  But you can add:  Tylenol: 2  tablets up to 3-4 times a day  Supplements: Tart cherry juice and Curcumin (Turmeric extract) have good scientific evidence  - get the concentrated capsules or gelcaps over the counter so you do not get the calories from the juice.    Follow-up: No follow-ups on file.  Meds ordered this encounter  Medications   predniSONE (DELTASONE) 20 MG tablet    Sig: 2 tabs po daily for 5 days, then 1 tab po daily for 5 days    Dispense:  15 tablet    Refill:  0   meloxicam (MOBIC) 15 MG tablet    Sig: Take 1 tablet (15 mg total) by mouth daily.    Dispense:  30 tablet    Refill:  2   There are no discontinued medications. No orders of the  defined types were placed in this encounter.   Signed,  Maud Deed. Valerie Cones, MD   Outpatient Encounter Medications as of 12/30/2019  Medication Sig   amLODipine (NORVASC) 10 MG tablet Take 1 tablet (10 mg total) by mouth daily for 90 doses.   esomeprazole (NEXIUM) 20 MG capsule Take 20 mg by mouth daily at 12 noon.   fenofibrate 160 MG tablet Take 1 tablet (160 mg total) by mouth daily.   icosapent Ethyl (VASCEPA) 1 g capsule Take 2 capsules (2 g total) by mouth 2 (two) times daily.   levothyroxine (SYNTHROID) 50 MCG tablet Take 1 tablet (50 mcg total) by mouth daily.   loratadine (CLARITIN) 10 MG tablet Take 10 mg by mouth daily as needed for allergies.    Multiple Vitamin (MULTIVITAMIN) tablet Take 1 tablet by mouth daily.   Multiple Vitamins-Minerals (PRESERVISION AREDS 2 PO) Take 1 capsule by mouth 2 (two) times daily.    NEOMYCIN-POLYMYXIN-HYDROCORTISONE (CORTISPORIN) 1 % SOLN OTIC solution Apply 1-2 drops to toe BID after soaking   propranolol (INDERAL) 80 MG tablet Take 1 tablet (80 mg total) by mouth 2 (two) times daily. (Patient taking differently: Take 80 mg by mouth 2 (two) times daily as needed. )   rosuvastatin (CRESTOR) 20 MG tablet Take 1 tablet (20 mg total) by mouth daily.   triamcinolone cream (KENALOG) 0.5 % Apply 1 application topically 3 (three) times daily as needed.   triamterene-hydrochlorothiazide (MAXZIDE-25) 37.5-25 MG tablet Take 1 tablet by mouth daily.   meloxicam (MOBIC) 15 MG tablet Take 1 tablet (15 mg total) by mouth daily.   predniSONE (DELTASONE) 20 MG tablet 2 tabs po daily for 5 days, then 1 tab po daily for 5 days   No facility-administered encounter medications on file as of 12/30/2019.

## 2019-12-30 NOTE — Patient Instructions (Addendum)
OSTEOARTHRITIS: Over the counter  Try the steroids and Meloxicam first: Take the 10 days of steroids, then switch to Meloxicam daily  You can use your wrist splint to rest the wrist at night time sometimes  But you can add:  Tylenol: 2 tablets up to 3-4 times a day  Supplements: Tart cherry juice and Curcumin (Turmeric extract) have good scientific evidence  - get the concentrated capsules or gelcaps over the counter so you do not get the calories from the juice.

## 2019-12-31 ENCOUNTER — Encounter: Payer: Self-pay | Admitting: Family Medicine

## 2020-01-24 ENCOUNTER — Other Ambulatory Visit: Payer: Self-pay | Admitting: Cardiology

## 2020-01-24 DIAGNOSIS — I10 Essential (primary) hypertension: Secondary | ICD-10-CM

## 2020-01-28 ENCOUNTER — Telehealth: Payer: Self-pay | Admitting: Pharmacist

## 2020-01-28 NOTE — Telephone Encounter (Signed)
Patient called to report BP readings.  Currently on amlodipine 10mg  daily, triamterence/hctz 37.5/25, and propranolol 40 mg  (migraine prophylaxis - reduced from 80mg .)  BP readings:  9/13 131/80 70 9/12 135/84 71 9/11 132/88 72 9/9 126/84 77 9/6 131/84 70 9/1 129/81 80 8/31 122/77 78 8/29 117/77 73 8/25 128/84 75 8/24 129/83 85 8/22 128/83 85 8/21 129/79 82  Patient BP right around goal level for the past month.  Had an eye injection this morning for macular degeneration and reports he has been anxious this week regarding it.  Reports has had more energy since reducing his dose of propranolol.

## 2020-01-29 ENCOUNTER — Ambulatory Visit (INDEPENDENT_AMBULATORY_CARE_PROVIDER_SITE_OTHER): Payer: Managed Care, Other (non HMO)

## 2020-01-29 ENCOUNTER — Other Ambulatory Visit: Payer: Self-pay

## 2020-01-29 DIAGNOSIS — Z23 Encounter for immunization: Secondary | ICD-10-CM

## 2020-02-25 ENCOUNTER — Telehealth: Payer: Self-pay | Admitting: Pharmacist

## 2020-02-25 NOTE — Telephone Encounter (Signed)
Patient called back to report blood pressure readings.  Reports wrist pain is much better since seeing orthopedic doctor.  10/11: 130/84 82 10/6: 129/80 78 10/5: 139/87 80 9/28: 134/81 81 9/27: 121/85 83 9/25: 134/88 66 9/22: 133/81 79 9/21: 126/77 78 9/20: 125/85 79 9/17: 131/85 74 9/16: 127/77 84  Patient's blood pressure continues to hover around goal level and he feels well and having no adverse effects.  No changes needed at this time.  Will follow up in 1 month.

## 2020-02-25 NOTE — Telephone Encounter (Signed)
Called patient to ask about BP results.  Patient at work, will call back when he gets home

## 2020-03-08 ENCOUNTER — Other Ambulatory Visit: Payer: Self-pay | Admitting: Family Medicine

## 2020-03-08 DIAGNOSIS — E785 Hyperlipidemia, unspecified: Secondary | ICD-10-CM

## 2020-03-08 DIAGNOSIS — E039 Hypothyroidism, unspecified: Secondary | ICD-10-CM

## 2020-03-08 DIAGNOSIS — R7309 Other abnormal glucose: Secondary | ICD-10-CM

## 2020-03-08 DIAGNOSIS — Z125 Encounter for screening for malignant neoplasm of prostate: Secondary | ICD-10-CM

## 2020-03-10 ENCOUNTER — Other Ambulatory Visit: Payer: Self-pay | Admitting: Family Medicine

## 2020-03-16 ENCOUNTER — Other Ambulatory Visit (INDEPENDENT_AMBULATORY_CARE_PROVIDER_SITE_OTHER): Payer: Managed Care, Other (non HMO)

## 2020-03-16 ENCOUNTER — Other Ambulatory Visit: Payer: Self-pay

## 2020-03-16 DIAGNOSIS — E785 Hyperlipidemia, unspecified: Secondary | ICD-10-CM

## 2020-03-16 DIAGNOSIS — E039 Hypothyroidism, unspecified: Secondary | ICD-10-CM

## 2020-03-16 DIAGNOSIS — R7309 Other abnormal glucose: Secondary | ICD-10-CM | POA: Diagnosis not present

## 2020-03-16 DIAGNOSIS — Z125 Encounter for screening for malignant neoplasm of prostate: Secondary | ICD-10-CM

## 2020-03-16 LAB — LIPID PANEL
Cholesterol: 103 mg/dL (ref 0–200)
HDL: 29.3 mg/dL — ABNORMAL LOW (ref >39.00)
LDL Cholesterol: 41 mg/dL (ref 0–99)
NonHDL: 73.57
Total CHOL/HDL Ratio: 4
Triglycerides: 163 mg/dL — ABNORMAL HIGH (ref 0.0–149.0)
VLDL: 32.6 mg/dL (ref 0.0–40.0)

## 2020-03-16 LAB — COMPREHENSIVE METABOLIC PANEL
ALT: 31 U/L (ref 0–53)
AST: 25 U/L (ref 0–37)
Albumin: 4.5 g/dL (ref 3.5–5.2)
Alkaline Phosphatase: 30 U/L — ABNORMAL LOW (ref 39–117)
BUN: 16 mg/dL (ref 6–23)
CO2: 30 mEq/L (ref 19–32)
Calcium: 10 mg/dL (ref 8.4–10.5)
Chloride: 101 mEq/L (ref 96–112)
Creatinine, Ser: 1.06 mg/dL (ref 0.40–1.50)
GFR: 74.99 mL/min (ref >60.00)
Glucose, Bld: 113 mg/dL — ABNORMAL HIGH (ref 70–99)
Potassium: 4.3 mEq/L (ref 3.5–5.1)
Sodium: 139 mEq/L (ref 135–145)
Total Bilirubin: 0.5 mg/dL (ref 0.2–1.2)
Total Protein: 6.6 g/dL (ref 6.0–8.3)

## 2020-03-16 LAB — HEMOGLOBIN A1C: Hgb A1c MFr Bld: 6.4 % (ref 4.6–6.5)

## 2020-03-16 LAB — PSA: PSA: 1.53 ng/mL (ref 0.10–4.00)

## 2020-03-16 LAB — TSH: TSH: 2.64 u[IU]/mL (ref 0.35–4.50)

## 2020-03-20 ENCOUNTER — Other Ambulatory Visit: Payer: Self-pay | Admitting: Family Medicine

## 2020-03-20 ENCOUNTER — Ambulatory Visit (INDEPENDENT_AMBULATORY_CARE_PROVIDER_SITE_OTHER): Payer: Managed Care, Other (non HMO) | Admitting: Family Medicine

## 2020-03-20 ENCOUNTER — Encounter: Payer: Self-pay | Admitting: Family Medicine

## 2020-03-20 ENCOUNTER — Other Ambulatory Visit: Payer: Self-pay

## 2020-03-20 VITALS — BP 122/80 | HR 80 | Temp 96.7°F | Ht 65.25 in | Wt 203.1 lb

## 2020-03-20 DIAGNOSIS — R7309 Other abnormal glucose: Secondary | ICD-10-CM

## 2020-03-20 DIAGNOSIS — H3530 Unspecified macular degeneration: Secondary | ICD-10-CM

## 2020-03-20 DIAGNOSIS — Z0000 Encounter for general adult medical examination without abnormal findings: Secondary | ICD-10-CM | POA: Diagnosis not present

## 2020-03-20 DIAGNOSIS — E039 Hypothyroidism, unspecified: Secondary | ICD-10-CM

## 2020-03-20 DIAGNOSIS — K219 Gastro-esophageal reflux disease without esophagitis: Secondary | ICD-10-CM

## 2020-03-20 DIAGNOSIS — Z7189 Other specified counseling: Secondary | ICD-10-CM

## 2020-03-20 DIAGNOSIS — Z1211 Encounter for screening for malignant neoplasm of colon: Secondary | ICD-10-CM

## 2020-03-20 DIAGNOSIS — I10 Essential (primary) hypertension: Secondary | ICD-10-CM

## 2020-03-20 DIAGNOSIS — E785 Hyperlipidemia, unspecified: Secondary | ICD-10-CM

## 2020-03-20 MED ORDER — PROPRANOLOL HCL 80 MG PO TABS
80.0000 mg | ORAL_TABLET | Freq: Every day | ORAL | Status: DC
Start: 2020-03-20 — End: 2020-06-09

## 2020-03-20 MED ORDER — MELOXICAM 15 MG PO TABS: 15.0000 mg | ORAL_TABLET | Freq: Every day | ORAL | 3 refills | Status: AC

## 2020-03-20 MED ORDER — ESOMEPRAZOLE MAGNESIUM 40 MG PO CPDR
40.0000 mg | DELAYED_RELEASE_CAPSULE | Freq: Every day | ORAL | 3 refills | Status: DC
Start: 2020-03-20 — End: 2021-02-25

## 2020-03-20 NOTE — Progress Notes (Signed)
This visit occurred during the SARS-CoV-2 public health emergency.  Safety protocols were in place, including screening questions prior to the visit, additional usage of staff PPE, and extensive cleaning of exam room while observing appropriate contact time as indicated for disinfecting solutions.  CPE- See plan.  Routine anticipatory guidance given to patient.  See health maintenance.  The possibility exists that previously documented standard health maintenance information may have been brought forward from a previous encounter into this note.  If needed, that same information has been updated to reflect the current situation based on today's encounter.    Tetanus 2015 Flu 2021 PNA not due.  Shingles UTD covid vaccine 2021 PSA wnl 2021. FH noted.  Colonoscopy 2011 Living will d/w pt. Wife designated if patient were incapacitated.  Diet and exercise d/w pt. HCV and HIV screening prev done. D/w pt.  He is still caring for his son who has chronic illnesses, discussed.  [place] able [place] [employer].  Both really enjoyed the trip.  Elevated Cholesterol: Using medications without problems: yes Muscle aches: no Diet compliance: d/w pt. He is working on diet.   Exercise: d/w pt.    GERD.  Controlled with nexium 40mg   per day.  It helped.  Concurrently on meloxicam for joint pain, with relief.    Hyperglycemia.  A1c higher but not above 6.5.  Not on meds.  Discussed with patient about diet.  Was told by eye clinic that he had macular degeneration.  I found about this today.  We talked about eye clinic follow up.  He went to [facility] for follow [facility].  He saw Dr. George Ina.  R eye has dry macular changes.  He has wet macular changes in the left eye.  He had injection done x3 with f/u pending.  His vision is better in the meantime.    Hypothyroidism. TSH wnl.  No ADE on med.  Compliant.  No neck mass noted by  patient.    Hypertension:    Using medication without problems or lightheadedness: yes Chest pain with exertion:no Edema:no Short of breath:no He cut back on propranolol to 1 tab a day and fatigue improved.    PMH and SH reviewed  Meds, vitals, and allergies reviewed.   ROS: Per HPI.  Unless specifically indicated otherwise in HPI, the patient denies:  General: fever. Eyes: acute vision changes ENT: sore throat Cardiovascular: chest pain Respiratory: SOB GI: vomiting GU: dysuria Musculoskeletal: acute back pain Derm: acute rash Neuro: acute motor dysfunction Psych: worsening mood Endocrine: polydipsia Heme: bleeding Allergy: hayfever  GEN: nad, alert and oriented HEENT: ncat NECK: supple w/o LA CV: rrr. PULM: ctab, no inc wob ABD: soft, +bs EXT: no edema SKIN: no acute rash

## 2020-03-20 NOTE — Patient Instructions (Signed)
Don't change your meds for now.  Keep working on your weight and update me as needed.  If you get your weight down, then we may need to change to BP meds.  Take care.  Glad to see you.

## 2020-03-21 NOTE — Telephone Encounter (Signed)
Previously addressed and sent.

## 2020-03-22 NOTE — Assessment & Plan Note (Signed)
Continue propranolol.  Lower dose of propranolol help with fatigue.  Continue amlodipine and triamterene hydrochlorothiazide.  He will update me as needed.  Labs discussed with patient.

## 2020-03-22 NOTE — Assessment & Plan Note (Signed)
Per our clinic.  I will defer.  He agrees.  He has follow-up pending.

## 2020-03-22 NOTE — Assessment & Plan Note (Signed)
Controlled with nexium 40mg   per day.  It helped.  Concurrently on meloxicam for joint pain, with relief.   Continue both Nexium and meloxicam.

## 2020-03-22 NOTE — Assessment & Plan Note (Signed)
Continue Crestor and Vascepa.  Continue work on diet and exercise.  Labs discussed with patient.  He agrees with plan.

## 2020-03-22 NOTE — Assessment & Plan Note (Signed)
TSH wnl.  No ADE on med.  Compliant.  No neck mass noted by patient.   Continue levothyroxine.

## 2020-03-22 NOTE — Assessment & Plan Note (Signed)
Living will d/w pt.  Wife designated if patient were incapacitated.   ?

## 2020-03-22 NOTE — Assessment & Plan Note (Signed)
Tetanus 2015 Flu 2021 PNA not due.  Shingles UTD covid vaccine 2021 PSA wnl 2021. FH noted.  Colonoscopy 2011 Living will d/w pt. Wife designated if patient were incapacitated.  Diet and exercise d/w pt. HCV and HIV screening prev done. D/w pt.  He is still caring for his son who has chronic illnesses, discussed.  [place] were able to [place] to [place] [employer].  Both really enjoyed the trip.

## 2020-03-22 NOTE — Assessment & Plan Note (Signed)
A1c higher but not above 6.5.  Not on meds.  Discussed with patient about diet.

## 2020-03-27 ENCOUNTER — Encounter: Payer: Self-pay | Admitting: Gastroenterology

## 2020-04-02 ENCOUNTER — Encounter: Payer: Self-pay | Admitting: Gastroenterology

## 2020-04-02 ENCOUNTER — Ambulatory Visit (AMBULATORY_SURGERY_CENTER): Payer: Self-pay | Admitting: *Deleted

## 2020-04-02 ENCOUNTER — Other Ambulatory Visit: Payer: Self-pay

## 2020-04-02 VITALS — Ht 65.25 in | Wt 206.0 lb

## 2020-04-02 DIAGNOSIS — Z1211 Encounter for screening for malignant neoplasm of colon: Secondary | ICD-10-CM

## 2020-04-02 MED ORDER — PLENVU 140 G PO SOLR: 1.0000 | ORAL | 0 refills | Status: DC

## 2020-04-02 NOTE — Progress Notes (Signed)
Fully vax'd with booster  No egg or soy allergy known to patient  No issues with past sedation with any surgeries or procedures no intubation problems in the past  No FH of Malignant Hyperthermia No diet pills per patient No home 02 use per patient  No blood thinners per patient  Pt denies issues with constipation  No A fib or A flutter  EMMI video to pt or via [place] 19 guidelines implemented in PV today with Pt and RN   Plenvu  Coupon given to pt in PV today , Code to Pharmacy   Due to the COVID-19 pandemic we are asking patients to follow these guidelines. Please only bring one care partner. Please be aware that your care partner may wait in the car in the parking lot or if they feel like they will be too hot to wait in the car, they may wait in the lobby on the 4th floor. All care partners are required to wear a mask the entire time (we do not have any that we can provide them), they need to practice social distancing, and we will do a Covid check for all patient's and care partners when you arrive. Also we will check their temperature and your temperature. If the care partner waits in their car they need to stay in the parking lot the entire time and we will call them on their cell phone when the patient is ready for discharge so they can bring the car to the front of the building. Also all patient's will need to wear a mask into building.

## 2020-04-07 ENCOUNTER — Other Ambulatory Visit: Payer: Self-pay

## 2020-04-07 ENCOUNTER — Other Ambulatory Visit: Payer: Self-pay | Admitting: Family Medicine

## 2020-04-07 MED ORDER — ROSUVASTATIN CALCIUM 20 MG PO TABS: 20.0000 mg | ORAL_TABLET | Freq: Every day | ORAL | 3 refills | Status: AC

## 2020-04-12 ENCOUNTER — Other Ambulatory Visit: Payer: Self-pay | Admitting: Family Medicine

## 2020-04-15 ENCOUNTER — Other Ambulatory Visit: Payer: Self-pay

## 2020-04-15 ENCOUNTER — Encounter: Payer: Self-pay | Admitting: Gastroenterology

## 2020-04-15 ENCOUNTER — Ambulatory Visit (AMBULATORY_SURGERY_CENTER): Payer: Managed Care, Other (non HMO) | Admitting: Gastroenterology

## 2020-04-15 VITALS — BP 118/77 | HR 80 | Temp 97.8°F | Resp 14 | Ht 65.25 in | Wt 204.0 lb

## 2020-04-15 DIAGNOSIS — Z1211 Encounter for screening for malignant neoplasm of colon: Secondary | ICD-10-CM | POA: Diagnosis present

## 2020-04-15 DIAGNOSIS — D125 Benign neoplasm of sigmoid colon: Secondary | ICD-10-CM | POA: Diagnosis not present

## 2020-04-15 DIAGNOSIS — D123 Benign neoplasm of transverse colon: Secondary | ICD-10-CM

## 2020-04-15 DIAGNOSIS — D124 Benign neoplasm of descending colon: Secondary | ICD-10-CM

## 2020-04-15 MED ORDER — SODIUM CHLORIDE 0.9 % IV SOLN: 500.0000 mL | Freq: Once | INTRAVENOUS | Status: DC

## 2020-04-15 NOTE — Progress Notes (Signed)
Called to room to assist during endoscopic procedure.  Patient ID and intended procedure confirmed with present staff. Received instructions for my participation in the procedure from the performing physician.  

## 2020-04-15 NOTE — Progress Notes (Signed)
PT taken to PACU. Monitors in place. VSS. Report given to RN. 

## 2020-04-15 NOTE — Patient Instructions (Signed)
YOU HAD AN ENDOSCOPIC PROCEDURE TODAY AT THE [facility]:   Refer to the procedure report that was given to you for any specific questions about what was found during the examination.  If the procedure report does not answer your questions, please call your gastroenterologist to clarify.  If you requested that your care partner not be given the details of your procedure findings, then the procedure report has been included in a sealed envelope for you to review at your convenience later.  YOU SHOULD EXPECT: Some feelings of bloating in the abdomen. Passage of more gas than usual.  Walking can help get rid of the air that was put into your GI tract during the procedure and reduce the bloating. If you had a lower endoscopy (such as a colonoscopy or flexible sigmoidoscopy) you may notice spotting of blood in your stool or on the toilet paper. If you underwent a bowel prep for your procedure, you may not have a normal bowel movement for a few days.  Please Note:  You might notice some irritation and congestion in your nose or some drainage.  This is from the oxygen used during your procedure.  There is no need for concern and it should clear up in a day or so.  SYMPTOMS TO REPORT IMMEDIATELY:   Following lower endoscopy (colonoscopy or flexible sigmoidoscopy):  Excessive amounts of blood in the stool  Significant tenderness or worsening of abdominal pains  Swelling of the abdomen that is new, acute  Fever of 100F or higher  For urgent or emergent issues, a gastroenterologist can be reached at any hour by calling [phone]. Do not use MyChart messaging for urgent concerns.    DIET:  We do recommend a small meal at first, but then you may proceed to your regular diet.  Drink plenty of fluids but you should avoid alcoholic beverages for 24 hours.  ACTIVITY:  You should plan to take it easy for the rest of today and you should NOT DRIVE or use heavy machinery until tomorrow (because  of the sedation medicines used during the test).    FOLLOW UP: Our staff will call the number listed on your records 48-72 hours following your procedure to check on you and address any questions or concerns that you may have regarding the information given to you following your procedure. If we do not reach you, we will leave a message.  We will attempt to reach you two times.  During this call, we will ask if you have developed any symptoms of COVID 19. If you develop any symptoms (ie: fever, flu-like symptoms, shortness of breath, cough etc.) before then, please call [phone].  If you test positive for Covid 19 in the 2 weeks post procedure, please call and report this information to us.    If any biopsies were taken you will be contacted by phone or by letter within the next 1-3 weeks.  Please call us at [phone] if you have not heard about the biopsies in 3 weeks.    SIGNATURES/CONFIDENTIALITY: You and/or your care partner have signed paperwork which will be entered into your electronic medical record.  These signatures attest to the fact that that the information above on your After Visit Summary has been reviewed and is understood.  Full responsibility of the confidentiality of this discharge information lies with you and/or your care-partner. 

## 2020-04-15 NOTE — Op Note (Signed)
[facility] Patient Name: Adrian Dean Procedure Date: 04/15/2020 8:08 AM MRN: [id number] Endoscopist: Ladene Artist , MD Age: 64 Referring MD:  Date of Birth: 1956-04-25 Gender: Male Account #: [id number] Procedure:                Colonoscopy Indications:              Screening for colorectal malignant neoplasm Medicines:                Monitored Anesthesia Care Procedure:                Pre-Anesthesia Assessment:                           - Prior to the procedure, a History and Physical                            was performed, and patient medications and                            allergies were reviewed. The patient's tolerance of                            previous anesthesia was also reviewed. The risks                            and benefits of the procedure and the sedation                            options and risks were discussed with the patient.                            All questions were answered, and informed consent                            was obtained. Prior Anticoagulants: The patient has                            taken no previous anticoagulant or antiplatelet                            agents. ASA Grade Assessment: II - A patient with                            mild systemic disease. After reviewing the risks                            and benefits, the patient was deemed in                            satisfactory condition to undergo the procedure.                           After obtaining informed consent, the colonoscope  was passed under direct vision. Throughout the                            procedure, the patient's blood pressure, pulse, and                            oxygen saturations were monitored continuously. The                            Colonoscope was introduced through the anus and                            advanced to the the cecum, identified by                            appendiceal orifice and  ileocecal valve. The                            ileocecal valve, appendiceal orifice, and rectum                            were photographed. The quality of the bowel                            preparation was good. The colonoscopy was performed                            without difficulty. The patient tolerated the                            procedure well. Scope In: 8:11:31 AM Scope Out: 8:29:52 AM Scope Withdrawal Time: 0 hours 16 minutes 33 seconds  Total Procedure Duration: 0 hours 18 minutes 21 seconds  Findings:                 The perianal and digital rectal examinations were                            normal.                           Three sessile polyps were found in the sigmoid                            colon, descending colon and transverse colon. The                            polyps were 5 to 7 mm in size. These polyps were                            removed with a cold snare. Resection and retrieval                            were complete.  Internal hemorrhoids were found during                            retroflexion. The hemorrhoids were mild and Grade I                            (internal hemorrhoids that do not prolapse).                           The exam was otherwise without abnormality on                            direct and retroflexion views. Complications:            No immediate complications. Estimated blood loss:                            None. Estimated Blood Loss:     Estimated blood loss: none. Impression:               - Three 5 to 7 mm polyps in the sigmoid colon, in                            the descending colon and in the transverse colon,                            removed with a cold snare. Resected and retrieved.                           - Internal hemorrhoids.                           - The examination was otherwise normal on direct                            and retroflexion views. Recommendation:           -  Repeat colonoscopy date to be determined after                            pending pathology results are reviewed for                            surveillance based on pathology results.                           - Patient has a contact number available for                            emergencies. The signs and symptoms of potential                            delayed complications were discussed with the                            patient. Return to  normal activities tomorrow.                            Written discharge instructions were provided to the                            patient.                           - Resume previous diet.                           - Continue present medications.                           - Await pathology results. Ladene Artist, MD 04/15/2020 8:33:46 AM This report has been signed electronically.

## 2020-04-15 NOTE — Progress Notes (Signed)
Pt's states no medical or surgical changes since previsit or office visit.  CW - vitals 

## 2020-04-17 ENCOUNTER — Telehealth: Payer: Self-pay

## 2020-04-17 NOTE — Telephone Encounter (Signed)
  Follow up Call-  Call back number 04/15/2020  Post procedure Call Back phone  # [phone]  Permission to leave phone message Yes  Some recent data might be hidden     Patient questions:  Do you have a fever, pain , or abdominal swelling? No. Pain Score  0 *  Have you tolerated food without any problems? Yes.    Have you been able to return to your normal activities? Yes.    Do you have any questions about your discharge instructions: Diet   No. Medications  No. Follow up visit  No.  Do you have questions or concerns about your Care? No.  Actions: * If pain score is 4 or above: No action needed, pain <4.  1. Have you developed a fever since your procedure? no  2.   Have you had an respiratory symptoms (SOB or cough) since your procedure? no  3.   Have you tested positive for COVID 19 since your procedure no  4.   Have you had any family members/close contacts diagnosed with the COVID 19 since your procedure?   no   If yes to any of these questions please route to Joylene John, RN and Joella Prince, RN

## 2020-04-30 ENCOUNTER — Encounter: Payer: Self-pay | Admitting: Gastroenterology

## 2020-04-30 ENCOUNTER — Other Ambulatory Visit: Payer: Self-pay | Admitting: Family Medicine

## 2020-05-04 ENCOUNTER — Encounter: Payer: Self-pay | Admitting: Gastroenterology

## 2020-05-05 ENCOUNTER — Telehealth: Payer: Self-pay

## 2020-05-05 NOTE — Telephone Encounter (Signed)
**Note De-Identified Arad Burston Obfuscation** I started a Vascepa PA through covermymeds and received the following message:  Ladona Horns (Key: [id number]) Vascepa 1GM capsules Form: [employer] Electronic PA Form (2017 NCPDP) Determination: Favorable Prior authorization for Vascepa has been approved.  Message from plan: Case [id number]; Status: Approved;Review Type:Prior Auth Coverage Start Date:05/05/2020;Coverage End Date:05/05/2021.  I have notified [facility] of this approval.

## 2020-06-06 ENCOUNTER — Other Ambulatory Visit: Payer: Self-pay | Admitting: Family Medicine

## 2020-06-22 ENCOUNTER — Telehealth: Payer: Self-pay | Admitting: Family Medicine

## 2020-06-22 NOTE — Telephone Encounter (Signed)
Called and LMOVM for patient.  I didn't leave confidential info.   We didn't get enough lead time from ortho clinic to arrange for preop visit prior to his surgery.  I would like to talk to patient about that.  I will try to call him again later.

## 2020-06-23 NOTE — Telephone Encounter (Signed)
Late entry.  Called pt yesterday after clinic.   He isn't having CP or SOB. He doesn't have cardiovascular limitation.  He can still exert at work.  His issue is shoulder pain.  I talked to him about the lack of adequate advance notice to arrange formal pre op eval.  He had relatively recent labs and also had EKG < 1 year ago.  This isn't formal eval for preop, but I don't have historical information that would preclude shoulder scope.  I will defer to ortho about proceeding with surgery.  I had called ortho yesterday and gave my cell phone number to staff to pass along to Dr. French Ana.  Please call ortho clinic and pass this message along to Dr. French Ana.  Thanks.

## 2020-06-23 NOTE — Telephone Encounter (Signed)
Called and left message of below on nurse line; also faxed this over with preop form. Advised to call back if any questions.

## 2020-06-23 NOTE — Telephone Encounter (Signed)
Dr. Damita Dunnings was able to speak with Dr. French Ana this am about patients surgery. Dr. French Ana does not think there will be a problem doing his surgery tomorrow. Called patient and let him know he should be good to have his surgery tomorrow per Dr. French Ana and Dr. Damita Dunnings.

## 2020-09-14 ENCOUNTER — Other Ambulatory Visit: Payer: Self-pay | Admitting: Internal Medicine

## 2020-10-07 ENCOUNTER — Telehealth: Payer: Self-pay | Admitting: Family Medicine

## 2020-10-07 NOTE — Telephone Encounter (Signed)
Mr. Adrian Dean called in wanted to let Dr. Damita Dunnings know that he is moving to [facility] and thank you for all his service and years

## 2020-10-13 NOTE — Telephone Encounter (Signed)
I called and thanked patient.  I was always glad to see this kind gentleman and I wish him only the best.  When he gets set up with a new doc in [place] they can request a record transfer if needed.

## 2020-11-09 ENCOUNTER — Other Ambulatory Visit: Payer: Self-pay | Admitting: Internal Medicine

## 2020-11-09 ENCOUNTER — Telehealth: Payer: Self-pay | Admitting: Pharmacist

## 2020-11-09 DIAGNOSIS — I10 Essential (primary) hypertension: Secondary | ICD-10-CM

## 2020-11-09 MED ORDER — ICOSAPENT ETHYL 1 G PO CAPS
2.0000 g | ORAL_CAPSULE | Freq: Two times a day (BID) | ORAL | 0 refills | Status: DC
Start: 2020-11-09 — End: 2021-02-01

## 2020-11-09 MED ORDER — AMLODIPINE BESYLATE 10 MG PO TABS: 10.0000 mg | ORAL_TABLET | Freq: Every day | ORAL | 0 refills | Status: AC

## 2020-11-09 NOTE — Telephone Encounter (Signed)
Patient called requesting refill on Vascepa and amlodipine. States she moved to [facility]. Advised I will give him 90 days of both- he will need to find cardiologist or PCP to refill further.

## 2020-11-13 ENCOUNTER — Other Ambulatory Visit: Payer: Self-pay | Admitting: Internal Medicine

## 2020-12-04 ENCOUNTER — Other Ambulatory Visit: Payer: Self-pay | Admitting: Family Medicine

## 2021-01-30 ENCOUNTER — Other Ambulatory Visit: Payer: Self-pay | Admitting: Internal Medicine

## 2021-02-23 ENCOUNTER — Other Ambulatory Visit: Payer: Self-pay | Admitting: Family Medicine

## 2021-03-01 ENCOUNTER — Other Ambulatory Visit: Payer: Self-pay | Admitting: Internal Medicine

## 2021-03-01 ENCOUNTER — Other Ambulatory Visit: Payer: Self-pay | Admitting: Family Medicine

## 2021-03-02 ENCOUNTER — Other Ambulatory Visit: Payer: Self-pay | Admitting: Family Medicine

## 2021-04-05 ENCOUNTER — Telehealth: Payer: Self-pay

## 2021-04-05 NOTE — Telephone Encounter (Signed)
**Note De-Identified Adrian Dean Obfuscation** Message received X 3: Patient Inactive  Per note in the pts chart from 11/04/20 the pt has moved to [place] and has been advised to find a cardiologist there to continue er medication refills. Pt not seen in office since 10/27/19.

## 2021-04-05 NOTE — Telephone Encounter (Signed)
**Note De-Identified Adrian Dean Obfuscation** Vascepa PA started through [employer]eds. Key: [id number]

## 2021-04-16 ENCOUNTER — Telehealth: Payer: Self-pay | Admitting: Internal Medicine

## 2021-04-16 NOTE — Telephone Encounter (Signed)
[employer]Meds was calling in regards to a prior auth about the patient's Vascepa

## 2021-04-18 ENCOUNTER — Other Ambulatory Visit: Payer: Self-pay | Admitting: Family Medicine

## 2021-04-19 NOTE — Telephone Encounter (Signed)
**Note De-Identified Adrian Dean Obfuscation** April 05, 2021 Me   2:53 PM Note Message received X 3 Neela Zecca covermymeds: Patient Inactive   Per note in the pts chart from 11/04/20 the pt has moved to [place] and has been advised to find a cardiologist there to continue her medication refills. Pt not seen in office since 10/27/19.

## 2021-04-20 ENCOUNTER — Other Ambulatory Visit: Payer: Self-pay | Admitting: Family Medicine

## 2021-04-22 ENCOUNTER — Other Ambulatory Visit: Payer: Self-pay | Admitting: Family Medicine

## 2021-04-27 ENCOUNTER — Other Ambulatory Visit: Payer: Self-pay | Admitting: Family Medicine

## 2021-08-19 ENCOUNTER — Other Ambulatory Visit: Payer: Self-pay | Admitting: Family Medicine

## 2021-08-19 DIAGNOSIS — I10 Essential (primary) hypertension: Secondary | ICD-10-CM

## 2021-08-19 NOTE — Telephone Encounter (Signed)
Refill request for propranolol 80 mg tablets ? ?LOV - 03/20/20 ?Next OV - not scheduled ?Last refill - 12/04/20 #180/3 ? ?

## 2021-08-22 NOTE — Telephone Encounter (Signed)
He moved to [place]. I put a note on the rx to check with MD/pharmacy there. Thanks.  ?

## 2021-12-11 ENCOUNTER — Other Ambulatory Visit: Payer: Self-pay | Admitting: Family Medicine
